# Patient Record
Sex: Male | Born: 1965
Health system: Southern US, Community
[De-identification: ages and names within clinical notes are randomized; demographics above are authoritative.]

## PROBLEM LIST (undated history)

## (undated) DIAGNOSIS — Z889 Allergy status to unspecified drugs, medicaments and biological substances status: Secondary | ICD-10-CM

## (undated) DIAGNOSIS — T7840XA Allergy, unspecified, initial encounter: Secondary | ICD-10-CM

## (undated) DIAGNOSIS — R1115 Cyclical vomiting syndrome unrelated to migraine: Secondary | ICD-10-CM

## (undated) DIAGNOSIS — R740 Nonspecific elevation of levels of transaminase and lactic acid dehydrogenase [LDH]: Principal | ICD-10-CM

## (undated) DIAGNOSIS — F17200 Nicotine dependence, unspecified, uncomplicated: Secondary | ICD-10-CM

## (undated) DIAGNOSIS — E039 Hypothyroidism, unspecified: Secondary | ICD-10-CM

## (undated) DIAGNOSIS — K760 Fatty (change of) liver, not elsewhere classified: Secondary | ICD-10-CM

## (undated) DIAGNOSIS — Z86010 Personal history of colonic polyps: Secondary | ICD-10-CM

## (undated) HISTORY — DX: Cyclical vomiting syndrome unrelated to migraine: R11.15

## (undated) HISTORY — DX: Nicotine dependence, unspecified, uncomplicated: F17.200

## (undated) HISTORY — DX: Nonspecific elevation of levels of transaminase and lactic acid dehydrogenase (ldh): R74.0

## (undated) HISTORY — DX: Allergy, unspecified, initial encounter: T78.40XA

## (undated) HISTORY — DX: Allergy status to unspecified drugs, medicaments and biological substances: Z88.9

## (undated) HISTORY — DX: Fatty (change of) liver, not elsewhere classified: K76.0

## (undated) HISTORY — PX: VASECTOMY: SHX75

## (undated) HISTORY — DX: Hypothyroidism, unspecified: E03.9

## (undated) HISTORY — DX: Personal history of colonic polyps: Z86.010

---

## 2012-04-14 ENCOUNTER — Encounter: Payer: Self-pay | Admitting: Family Medicine

## 2012-04-14 ENCOUNTER — Ambulatory Visit (INDEPENDENT_AMBULATORY_CARE_PROVIDER_SITE_OTHER): Payer: Self-pay | Admitting: Family Medicine

## 2012-04-14 VITALS — BP 110/70 | HR 72 | Temp 97.7°F | Ht 69.0 in | Wt 174.2 lb

## 2012-04-14 DIAGNOSIS — R112 Nausea with vomiting, unspecified: Secondary | ICD-10-CM | POA: Insufficient documentation

## 2012-04-14 DIAGNOSIS — F17200 Nicotine dependence, unspecified, uncomplicated: Secondary | ICD-10-CM

## 2012-04-14 DIAGNOSIS — Z87891 Personal history of nicotine dependence: Secondary | ICD-10-CM | POA: Insufficient documentation

## 2012-04-14 DIAGNOSIS — F1011 Alcohol abuse, in remission: Secondary | ICD-10-CM

## 2012-04-14 DIAGNOSIS — R109 Unspecified abdominal pain: Secondary | ICD-10-CM

## 2012-04-14 DIAGNOSIS — R1010 Upper abdominal pain, unspecified: Secondary | ICD-10-CM

## 2012-04-14 NOTE — Assessment & Plan Note (Addendum)
Concerning sxs, ?PUD, gallstones or hepatitis. Discussed I would want to start w/u with blood work (check CBC, lipase, CMP, possibly H pylori given endorses international travel) and then possibly abd [place]. Pt wants to wait until beginning of year for blood work 2/2 financial reasons, will return fasting for this and afterwards for physical. Discussed if any return of sxs or worsening to return to see [place] in interim. Advised against NSAIDs and EtOH. For reflux discussed could use OTC zantac or pepcid

## 2012-04-14 NOTE — Assessment & Plan Note (Signed)
Encouraged avoiding EtOH.

## 2012-04-14 NOTE — Addendum Note (Signed)
Addended by: Eustaquio Boyden on: 04/14/2012 11:35 AM   Modules accepted: Level of Service

## 2012-04-14 NOTE — Assessment & Plan Note (Signed)
Longtime smoker, 30+PY Encouraged cessation. States he is planning on quitting cold [place].  Contemplative.

## 2012-04-14 NOTE — Progress Notes (Signed)
Subjective:    Patient ID: Malik Perry, male    DOB: 1965/06/27, 46 y.o.   MRN: [id number]  HPI CC: new pt to establish  No prior PCP.  Has seen [facility] several times.  Latest [facility].  Several year history of abd pain (since 1992).  Nausea/vomiting for several days at a time.  RUQ abd pain associated with this.  Improves with IM phenergan.  Possibly related to eating hamburgers.  Did improve for 8 years after stopping hamburger meat, recently restarted eating hamburger 1 year ago and again getting ill.  Seen several weeks ago at [facility] urgent care, treated with phenergan IM and suppositories and improved.  + diarrhea with these episodes.  Occasional reflux sxs improved with tums.  Denies blood in stool, blood with emesis.  NBNB.  Denies early satiety.  Denies dysphagia.  Denies appetite changes or weight changes.    No EtOH in last 4 months.  Strong h/o EtOH use years ago.  Prior drank a fifth nightly for 6-7 yrs.  Now when he drinks tries to limit to 2 drinks/sitting. Denies any NSAID use.  Smoker - 1 ppd for 30 yrs.  Quit last year  But recently restarted.  Back to 1 ppd.  Smokes more when drinks.  Preventative: no recent CPE Flu shot - declines tetanus shot - done about 1990s.  Declines today  Lives with fiancee, 1 child, and cat Occupation: unemployed Edu: HS Activity: no regular exercise Diet: some water, fruits/vegetables occasionally  Medications and allergies reviewed and updated in chart.  Past histories reviewed and updated if relevant as below. There is no problem list on file for this patient.  Past Medical History  Diagnosis Date  . H/O seasonal allergies   . Smoker    Past Surgical History  Procedure Date  . Vasectomy    History  Substance Use Topics  . Smoking status: Current Every Day Smoker -- 1.0 packs/day for 30 years    Types: Cigarettes  . Smokeless tobacco: Never Used  . Alcohol Use: Yes     Comment: rarely (1-2 beer or a few  shots/sitting)   Family History  Problem Relation Age of Onset  . Heart disease Mother     MI and defibrillator in place  . Cancer Father     thyroid cancer  . Cancer Sister     2 half sisters with thyroid cancer  . COPD Maternal Grandmother     smoker  . Cancer Maternal Grandmother     grandmother' sisters - brain, lung, lymphoma  . Diabetes Mother   . Alcohol abuse Mother    No Known Allergies No current outpatient prescriptions on file prior to visit.     Review of Systems  Constitutional: Negative for fever, chills, activity change, appetite change, fatigue and unexpected weight change.  HENT: Negative for hearing loss and neck pain.   Eyes: Negative for visual disturbance.  Respiratory: Positive for cough and shortness of breath. Negative for chest tightness and wheezing.   Cardiovascular: Negative for chest pain, palpitations and leg swelling.  Gastrointestinal: Positive for nausea, vomiting, abdominal pain and diarrhea. Negative for constipation, blood in stool and abdominal distention.  Genitourinary: Negative for hematuria and difficulty urinating.  Musculoskeletal: Negative for myalgias and arthralgias.  Skin: Negative for rash.  Neurological: Positive for dizziness (orthostatic). Negative for seizures, syncope and headaches.  Hematological: Bruises/bleeds easily.  Psychiatric/Behavioral: Negative for dysphoric mood. The patient is not nervous/anxious.  Objective:   Physical Exam  Nursing note and vitals reviewed. Constitutional: He is oriented to person, place, and time. He appears well-developed and well-nourished. No distress.       Very deep voice  HENT:  Head: Normocephalic and atraumatic.  Right Ear: Hearing, tympanic membrane, external ear and ear canal normal.  Left Ear: Hearing, tympanic membrane, external ear and ear canal normal.  Nose: Nose normal. No mucosal edema or rhinorrhea.  Mouth/Throat: Uvula is midline, oropharynx is clear and moist  and mucous membranes are normal. No oropharyngeal exudate, posterior oropharyngeal edema, posterior oropharyngeal erythema or tonsillar abscesses.  Eyes: Conjunctivae normal and EOM are normal. Pupils are equal, round, and reactive to light. No scleral icterus.  Neck: Normal range of motion. Neck supple. No thyromegaly present.       No nodules appreciated  Cardiovascular: Normal rate, regular rhythm, normal heart sounds and intact distal pulses.   No murmur heard. Pulses:      Radial pulses are 2+ on the right side, and 2+ on the left side.  Pulmonary/Chest: Effort normal and breath sounds normal. No respiratory distress. He has no wheezes. He has no rales.  Abdominal: Soft. Bowel sounds are normal. He exhibits no distension and no mass. There is no hepatosplenomegaly. There is no tenderness. There is no rebound, no guarding and no CVA tenderness.  Musculoskeletal: Normal range of motion. He exhibits no edema.  Lymphadenopathy:    He has no cervical adenopathy.  Neurological: He is alert and oriented to person, place, and time.       CN grossly intact, station and gait intact  Skin: Skin is warm and dry. No rash noted.  Psychiatric: He has a normal mood and affect. His behavior is normal. Judgment and thought content normal.       Assessment & Plan:

## 2012-04-14 NOTE — Patient Instructions (Signed)
Work on quitting smoking!  Best thing you can do for your health.  Limit alcohol - to avoid liver damage. Return at your convenience fasting for blood work, afterwards for physical. If any worsening in the meantime, come back to see me. For reflux symptoms you could try over the counter zantac or pepcid. Good to meet you today, call us with questions.

## 2012-05-14 ENCOUNTER — Ambulatory Visit: Payer: Self-pay | Admitting: Family Medicine

## 2012-07-03 ENCOUNTER — Other Ambulatory Visit: Payer: Self-pay | Admitting: Family Medicine

## 2012-07-03 DIAGNOSIS — F1011 Alcohol abuse, in remission: Secondary | ICD-10-CM

## 2012-07-03 DIAGNOSIS — Z0000 Encounter for general adult medical examination without abnormal findings: Secondary | ICD-10-CM

## 2012-07-03 DIAGNOSIS — R1010 Upper abdominal pain, unspecified: Secondary | ICD-10-CM

## 2012-07-05 ENCOUNTER — Other Ambulatory Visit (INDEPENDENT_AMBULATORY_CARE_PROVIDER_SITE_OTHER): Payer: BC Managed Care – PPO

## 2012-07-05 DIAGNOSIS — F1011 Alcohol abuse, in remission: Secondary | ICD-10-CM

## 2012-07-05 DIAGNOSIS — Z0000 Encounter for general adult medical examination without abnormal findings: Secondary | ICD-10-CM

## 2012-07-05 DIAGNOSIS — R1010 Upper abdominal pain, unspecified: Secondary | ICD-10-CM

## 2012-07-05 DIAGNOSIS — R109 Unspecified abdominal pain: Secondary | ICD-10-CM

## 2012-07-05 LAB — COMPREHENSIVE METABOLIC PANEL
AST: 411 U/L — ABNORMAL HIGH (ref 0–37)
Alkaline Phosphatase: 59 U/L (ref 39–117)
BUN: 17 mg/dL (ref 6–23)
Creatinine, Ser: 1.4 mg/dL (ref 0.4–1.5)
Glucose, Bld: 94 mg/dL (ref 70–99)

## 2012-07-05 LAB — CBC WITH DIFFERENTIAL/PLATELET
Basophils Relative: 0.9 % (ref 0.0–3.0)
Eosinophils Absolute: 0.2 10*3/uL (ref 0.0–0.7)
Eosinophils Relative: 2.5 % (ref 0.0–5.0)
Hemoglobin: 12.2 g/dL — ABNORMAL LOW (ref 13.0–17.0)
Lymphocytes Relative: 41.7 % (ref 12.0–46.0)
MCHC: 33.5 g/dL (ref 30.0–36.0)
MCV: 96.7 fl (ref 78.0–100.0)
Monocytes Absolute: 0.6 10*3/uL (ref 0.1–1.0)
Neutro Abs: 4 10*3/uL (ref 1.4–7.7)
Neutrophils Relative %: 47.9 % (ref 43.0–77.0)
RBC: 3.76 Mil/uL — ABNORMAL LOW (ref 4.22–5.81)
WBC: 8.4 10*3/uL (ref 4.5–10.5)

## 2012-07-05 LAB — LIPID PANEL
Cholesterol: 237 mg/dL — ABNORMAL HIGH (ref 0–200)
HDL: 47.7 mg/dL (ref >39.00)
Triglycerides: 203 mg/dL — ABNORMAL HIGH (ref 0.0–149.0)
VLDL: 40.6 mg/dL — ABNORMAL HIGH (ref 0.0–40.0)

## 2012-07-05 LAB — LIPASE: Lipase: 31 U/L (ref 11.0–59.0)

## 2012-07-06 ENCOUNTER — Telehealth: Payer: Self-pay | Admitting: Family Medicine

## 2012-07-06 ENCOUNTER — Ambulatory Visit: Payer: BC Managed Care – PPO

## 2012-07-06 DIAGNOSIS — R7401 Elevation of levels of liver transaminase levels: Secondary | ICD-10-CM

## 2012-07-06 DIAGNOSIS — R1010 Upper abdominal pain, unspecified: Secondary | ICD-10-CM

## 2012-07-06 HISTORY — DX: Elevation of levels of liver transaminase levels: R74.01

## 2012-07-06 NOTE — Telephone Encounter (Signed)
Lab appt scheduled.

## 2012-07-06 NOTE — Telephone Encounter (Signed)
Unable to add viral hepatitis panel to blood in lab - can we have him come in for rpt blood work?  Thanks.

## 2012-07-07 LAB — FERRITIN: Ferritin: 294.9 ng/mL (ref 22.0–322.0)

## 2012-07-09 ENCOUNTER — Other Ambulatory Visit (INDEPENDENT_AMBULATORY_CARE_PROVIDER_SITE_OTHER): Payer: BC Managed Care – PPO

## 2012-07-09 DIAGNOSIS — R7401 Elevation of levels of liver transaminase levels: Secondary | ICD-10-CM

## 2012-07-09 DIAGNOSIS — R109 Unspecified abdominal pain: Secondary | ICD-10-CM

## 2012-07-09 DIAGNOSIS — R1010 Upper abdominal pain, unspecified: Secondary | ICD-10-CM

## 2012-07-10 DIAGNOSIS — K760 Fatty (change of) liver, not elsewhere classified: Secondary | ICD-10-CM

## 2012-07-10 HISTORY — DX: Fatty (change of) liver, not elsewhere classified: K76.0

## 2012-07-12 ENCOUNTER — Ambulatory Visit (INDEPENDENT_AMBULATORY_CARE_PROVIDER_SITE_OTHER): Payer: BC Managed Care – PPO | Admitting: Family Medicine

## 2012-07-12 ENCOUNTER — Encounter: Payer: Self-pay | Admitting: Family Medicine

## 2012-07-12 VITALS — BP 124/82 | HR 60 | Temp 98.1°F | Ht 69.0 in | Wt 182.8 lb

## 2012-07-12 DIAGNOSIS — E049 Nontoxic goiter, unspecified: Secondary | ICD-10-CM

## 2012-07-12 DIAGNOSIS — E039 Hypothyroidism, unspecified: Secondary | ICD-10-CM

## 2012-07-12 DIAGNOSIS — E010 Iodine-deficiency related diffuse (endemic) goiter: Secondary | ICD-10-CM

## 2012-07-12 DIAGNOSIS — F1011 Alcohol abuse, in remission: Secondary | ICD-10-CM

## 2012-07-12 DIAGNOSIS — R7401 Elevation of levels of liver transaminase levels: Secondary | ICD-10-CM

## 2012-07-12 DIAGNOSIS — F17200 Nicotine dependence, unspecified, uncomplicated: Secondary | ICD-10-CM

## 2012-07-12 HISTORY — DX: Hypothyroidism, unspecified: E03.9

## 2012-07-12 LAB — HEPATITIS PANEL, ACUTE
HCV Ab: NEGATIVE
Hep A IgM: NEGATIVE
Hep B C IgM: NEGATIVE

## 2012-07-12 NOTE — Patient Instructions (Signed)
Pass by Malik Perry's office for referral for thyroid and abdominal ultrasounds - this week if able. We will call you and may have you come back for more blood work depending on results. Good to see you today, call us with questions.

## 2012-07-12 NOTE — Assessment & Plan Note (Signed)
Continue to encourage cessation. 

## 2012-07-12 NOTE — Progress Notes (Signed)
Subjective:    Patient ID: Malik Perry, male    DOB: [birth year], 47 y.o.   MRN: [id number]  HPI CC: CPE converted to acute visit to discuss blood work  Since seen here 04/2012 to establish care - has been sick x3 separate times with nausea/vomiting.  Once to pizza, once to steak and once to [employer].  Remains nauseated for 2-3 days during these episodes.  Intermittent diarrhea with these emesis episodes.  Phenergan tablets/suppositories help with this.  Denies tylenol use.  Denies NSAID use including goody and bc powders, aspirin.  Denies EtOH use.  Denies blood in stool, blood with emesis. NBNB. Denies early satiety. Denies dysphagia. Denies appetite changes or weight changes. Chronic deep voice, since adolescence.  No EtOH in last 4 months. Strong h/o EtOH use years ago. Prior drank a fifth nightly for 6-7 yrs. Now when he drinks tries to limit to 2 drinks/sitting.  Smoker - 1 ppd for 30 yrs. Quit last year but recently restarted. Back to 1 ppd. Smokes more when he drinks. MJ user.  No fmhx liver problems.   ++ strong fmhx thyroid cancer (father and 2 half sisters).  Preventative: Flu shot - declines  tetanus shot - done about 1990s. Declines today   Lives with fiancee, 1 child, and cat  Occupation: unemployed  Edu: HS  Activity: no regular exercise  Diet: some water, fruits/vegetables occasionally  Wt Readings from Last 3 Encounters:  07/12/12 182 lb 12 oz (82.895 kg)  04/14/12 174 lb 4 oz (79.039 kg)   Medications and allergies reviewed and updated in chart.  Past histories reviewed and updated if relevant as below. Patient Active Problem List  Diagnosis  . Upper abdominal pain  . Smoker  . History of alcohol abuse  . Transaminitis   Past Medical History  Diagnosis Date  . H/O seasonal allergies   . Smoker    Past Surgical History  Procedure Date  . Vasectomy    History  Substance Use Topics  . Smoking status: Current Every Day Smoker -- 1.0  packs/day for 30 years    Types: Cigarettes  . Smokeless tobacco: Never Used  . Alcohol Use: Yes     Comment: rarely (1-2 beer or a few shots/sitting)   Family History  Problem Relation Age of Onset  . Heart disease Mother     MI and defibrillator in place  . Cancer Father     thyroid cancer  . Cancer Sister     2 half sisters with thyroid cancer  . COPD Maternal Grandmother     smoker  . Cancer Maternal Grandmother     grandmother' sisters - brain, lung, lymphoma  . Diabetes Mother   . Alcohol abuse Mother    No Known Allergies No current outpatient prescriptions on file prior to visit.    Review of Systems  Constitutional: Negative for fever, chills, activity change, appetite change, fatigue and unexpected weight change.  HENT: Negative for hearing loss and neck pain.   Eyes: Negative for visual disturbance.  Respiratory: Positive for shortness of breath (attributes to smoking). Negative for cough, chest tightness and wheezing.   Cardiovascular: Negative for chest pain, palpitations and leg swelling.  Gastrointestinal: Positive for nausea, vomiting, abdominal pain and diarrhea. Negative for constipation, blood in stool and abdominal distention.  Genitourinary: Negative for hematuria and difficulty urinating.  Musculoskeletal: Negative for myalgias and arthralgias.  Skin: Negative for rash.  Neurological: Positive for dizziness (orthostatic). Negative for seizures, syncope and  headaches.  Hematological: Bruises/bleeds easily.  Psychiatric/Behavioral: Negative for dysphoric mood. The patient is not nervous/anxious.        Objective:   Physical Exam  Nursing note and vitals reviewed. Constitutional: He is oriented to person, place, and time. He appears well-developed and well-nourished. No distress.  HENT:  Head: Normocephalic and atraumatic.  Right Ear: Hearing, tympanic membrane, external ear and ear canal normal.  Left Ear: Hearing, tympanic membrane, external ear  and ear canal normal.  Nose: Nose normal. No mucosal edema or rhinorrhea.  Mouth/Throat: Uvula is midline, oropharynx is clear and moist and mucous membranes are normal. No oropharyngeal exudate, posterior oropharyngeal edema, posterior oropharyngeal erythema or tonsillar abscesses.  Eyes: Conjunctivae normal and EOM are normal. Pupils are equal, round, and reactive to light.  Neck: Normal range of motion. Neck supple. Thyromegaly (bilateral, L > R) present.  Cardiovascular: Normal rate, regular rhythm, normal heart sounds and intact distal pulses.   No murmur heard. Pulses:      Radial pulses are 2+ on the right side, and 2+ on the left side.  Pulmonary/Chest: Effort normal and breath sounds normal. No respiratory distress. He has no wheezes. He has no rales.  Abdominal: Soft. Normal appearance and bowel sounds are normal. He exhibits no distension and no mass. There is no hepatosplenomegaly. There is no tenderness. There is no rebound and no guarding.       No HSM appreciated  Musculoskeletal: Normal range of motion.  Lymphadenopathy:    He has no cervical adenopathy.  Neurological: He is alert and oriented to person, place, and time.       CN grossly intact, station and gait intact  Skin: Skin is warm and dry. No rash noted.  Psychiatric: He has a normal mood and affect. His behavior is normal. Judgment and thought content normal.       Assessment & Plan:

## 2012-07-12 NOTE — Assessment & Plan Note (Addendum)
New marked transaminitis with normal ALP/bilirubin. In h/o heavy EtOH use, concern for alcoholic cirrhosis/hepatitis although plt normal. Check abd [place] today.  That will direct futher management. Discussed avoiding EtOH, NSAIDs and tylenol.

## 2012-07-12 NOTE — Assessment & Plan Note (Signed)
Encouraged to remain abstinent. 

## 2012-07-13 NOTE — Assessment & Plan Note (Signed)
New as well - along with thyromegaly. Given fmhx, check thyroid US.   If normal, start levothyroxine.

## 2012-07-19 ENCOUNTER — Ambulatory Visit
Admission: RE | Admit: 2012-07-19 | Discharge: 2012-07-19 | Disposition: A | Discharge status: Home / Self Care | Payer: BC Managed Care – PPO | Source: Ambulatory Visit | Attending: Family Medicine | Admitting: Family Medicine

## 2012-07-19 DIAGNOSIS — E039 Hypothyroidism, unspecified: Secondary | ICD-10-CM

## 2012-07-19 DIAGNOSIS — R7401 Elevation of levels of liver transaminase levels: Secondary | ICD-10-CM

## 2012-07-19 DIAGNOSIS — E010 Iodine-deficiency related diffuse (endemic) goiter: Secondary | ICD-10-CM

## 2012-07-20 ENCOUNTER — Other Ambulatory Visit: Payer: Self-pay | Admitting: Family Medicine

## 2012-07-20 DIAGNOSIS — R7401 Elevation of levels of liver transaminase levels: Secondary | ICD-10-CM

## 2012-07-20 MED ORDER — LEVOTHYROXINE SODIUM 88 MCG PO TABS: 88.0000 ug | ORAL_TABLET | Freq: Every day | ORAL | Status: DC

## 2012-07-28 ENCOUNTER — Other Ambulatory Visit: Payer: BC Managed Care – PPO

## 2012-07-28 ENCOUNTER — Encounter: Payer: Self-pay | Admitting: Family Medicine

## 2012-07-28 ENCOUNTER — Ambulatory Visit (INDEPENDENT_AMBULATORY_CARE_PROVIDER_SITE_OTHER): Payer: BC Managed Care – PPO | Admitting: Family Medicine

## 2012-07-28 VITALS — BP 114/70 | HR 60 | Temp 98.1°F | Wt 178.0 lb

## 2012-07-28 DIAGNOSIS — F1290 Cannabis use, unspecified, uncomplicated: Secondary | ICD-10-CM | POA: Insufficient documentation

## 2012-07-28 DIAGNOSIS — R7401 Elevation of levels of liver transaminase levels: Secondary | ICD-10-CM

## 2012-07-28 DIAGNOSIS — F1210 Cannabis abuse, uncomplicated: Secondary | ICD-10-CM

## 2012-07-28 DIAGNOSIS — F1011 Alcohol abuse, in remission: Secondary | ICD-10-CM

## 2012-07-28 DIAGNOSIS — F17200 Nicotine dependence, unspecified, uncomplicated: Secondary | ICD-10-CM

## 2012-07-28 DIAGNOSIS — E039 Hypothyroidism, unspecified: Secondary | ICD-10-CM

## 2012-07-28 LAB — COMPREHENSIVE METABOLIC PANEL
Alkaline Phosphatase: 52 U/L (ref 39–117)
Glucose, Bld: 80 mg/dL (ref 70–99)
Sodium: 137 mEq/L (ref 135–145)
Total Bilirubin: 0.5 mg/dL (ref 0.3–1.2)
Total Protein: 7.6 g/dL (ref 6.0–8.3)

## 2012-07-28 LAB — CERULOPLASMIN: Ceruloplasmin: 25 mg/dL (ref 20–60)

## 2012-07-28 LAB — PROTIME-INR: Prothrombin Time: 11.4 s (ref 10.2–12.4)

## 2012-07-28 NOTE — Assessment & Plan Note (Signed)
Planning on quitting at end of month.

## 2012-07-28 NOTE — Assessment & Plan Note (Signed)
Workup so far only showing fatty infiltration of liver by abd [place].  ?autoimmune hepatitis given concommitant hypothyroidism.   Will recheck LFTs today as well as autoimmune liver panel. Discussed if not improved - recommend referral to gastroenterologist and possible liver biopsy. ?cannabinoid hyperemesis syndrome.

## 2012-07-28 NOTE — Patient Instructions (Addendum)
Return in 3 months for recheck thyroid and afterwards office visit to discuss results. Blood work today.  If staying elevated, I will recommend referral to GI specialist.

## 2012-07-28 NOTE — Assessment & Plan Note (Signed)
Not interested in stopping this.

## 2012-07-28 NOTE — Assessment & Plan Note (Signed)
Strong fmhx thyroid disease.  Mild thyromegaly on [place] but no discrete nodules.  Anticipate autoimmune hypothyroidism. Discussed importance of thyroid balance for body's metabolism. Pt adamant about wanting to try dietary changes and nutrition regimen to improve thyroid disorder. Requests 3 months of intensive diet changes to try and manage hypothyroidism "naturally" then will return for rpt thyroid functions and if remains undercontrolled, agrees to start levothyroxine.

## 2012-07-28 NOTE — Assessment & Plan Note (Signed)
Endorsed abstinence.

## 2012-07-28 NOTE — Progress Notes (Signed)
  Subjective:    Patient ID: Malik Perry, male    DOB: September 15, 1965, 47 y.o.   MRN: [id number]  HPI CC: discuss thyroid  Pt refuses thyroid medication currently.  Wants to try 3 months of "gerson regimen"" with natural vegetables/juices, and whole wheat grass.  Smoking - planning on quitting end of February.  MJ - regular use - does not plan on stopping. Denies other recreational drugs. EtOH - no more drinking.  Had one beer several weeks ago, led to nausea/vomiting. No tylenol or NSAID use.  Markedly elevated transaminitis - workup has included AST 411 and ALT 357, creatinine of 1.4, normal total bili and CBC, INR, normal iron and ferritin, mildly low % sat, mild anemia with Hgb 12.2, negative acute viral hepatitis panel, negative H pylori, and TSH of 35 with free T4 0.  Abdominal ultrasound returned showing anticipated fatty infiltration of liver. Thyroid ultrasound returned with mild thyromegaly.    No recent episodes of nausea/vomiting.  Medications and allergies reviewed and updated in chart.  Past histories reviewed and updated if relevant as below. Patient Active Problem List  Diagnosis  . Upper abdominal pain  . Smoker  . History of alcohol abuse  . Transaminitis  . Hypothyroidism   Past Medical History  Diagnosis Date  . H/O seasonal allergies   . Smoker   . Transaminitis 07/06/2012  . Hypothyroidism 07/12/2012   Past Surgical History  Procedure Laterality Date  . Vasectomy     History  Substance Use Topics  . Smoking status: Current Every Day Smoker -- 1.00 packs/day for 30 years    Types: Cigarettes  . Smokeless tobacco: Never Used  . Alcohol Use: Yes     Comment: rarely (1-2 beer or a few shots/sitting)   Family History  Problem Relation Age of Onset  . Heart disease Mother     MI and defibrillator in place  . Cancer Father     thyroid cancer  . Cancer Sister     2 half sisters with thyroid cancer  . COPD Maternal Grandmother     smoker  . Cancer  Maternal Grandmother     grandmother' sisters - brain, lung, lymphoma  . Diabetes Mother   . Alcohol abuse Mother    No Known Allergies Current Outpatient Prescriptions on File Prior to Visit  Medication Sig Dispense Refill  . levothyroxine (SYNTHROID, LEVOTHROID) 88 MCG tablet Take 1 tablet (88 mcg total) by mouth daily. Take 1/2 pill for the first week then one whole pill daily  90 tablet  3   No current facility-administered medications on file prior to visit.    Review of Systems Per HPI    Objective:   Physical Exam  Nursing note and vitals reviewed. Constitutional: He appears well-developed and well-nourished. No distress.  Strong smell of marijuana follows patient       Assessment & Plan:

## 2012-07-29 LAB — ANA: Anti Nuclear Antibody(ANA): NEGATIVE

## 2012-07-30 LAB — ANTI-SMOOTH MUSCLE ANTIBODY, IGG: Smooth Muscle Ab: 6 U (ref <20)

## 2012-10-14 ENCOUNTER — Telehealth: Payer: Self-pay

## 2012-10-14 NOTE — Telephone Encounter (Signed)
Pt said has already discussed with Dr Reece Agar and Selena Batten that "he does not do prescription drugs"; pt is fearful of possible side effects of medication that is not all natural.  Pt said [facility] has an thyroid hormone made from all natural derivatives but it does require a prescription. Pt wants to know if Dr Reece Agar would consider writing prescription; if so (pt does not know name of med) but request Dr Reece Agar or Selena Batten to contact [facility] at [phone] to get more info.Please advise.

## 2012-10-17 NOTE — Telephone Encounter (Signed)
Last visit we agreed to a 3 month trial of natural treatment with healthy diet and if thyroid values not improved we had discussed starting thyroid medication. [facility] therapy is an unsanctioned treatment for cancer, doesn't treat thyroid disease. I still recommend synthroid or levothyroxine medication to treat his thyroid disease. If he would like to bring me information, I will review with him. If he'd like I can refer to endocrinologist who is a specialist in thyroid disease, to again review options with him.

## 2012-10-18 NOTE — Telephone Encounter (Signed)
Notified patient. He is going to drop off info for you this week to review and then discuss it with you at his follow up in June. He said this is a new thyroid treatment.

## 2012-11-08 ENCOUNTER — Ambulatory Visit (INDEPENDENT_AMBULATORY_CARE_PROVIDER_SITE_OTHER): Payer: BC Managed Care – PPO | Admitting: Family Medicine

## 2012-11-08 ENCOUNTER — Encounter: Payer: Self-pay | Admitting: Family Medicine

## 2012-11-08 VITALS — BP 118/72 | HR 60 | Temp 98.2°F | Wt 173.8 lb

## 2012-11-08 DIAGNOSIS — F1290 Cannabis use, unspecified, uncomplicated: Secondary | ICD-10-CM

## 2012-11-08 DIAGNOSIS — Z23 Encounter for immunization: Secondary | ICD-10-CM

## 2012-11-08 DIAGNOSIS — F1210 Cannabis abuse, uncomplicated: Secondary | ICD-10-CM

## 2012-11-08 DIAGNOSIS — E039 Hypothyroidism, unspecified: Secondary | ICD-10-CM

## 2012-11-08 MED ORDER — THYROID 30 MG PO TABS: 30.0000 mg | ORAL_TABLET | Freq: Every day | ORAL | Status: DC

## 2012-11-08 NOTE — Assessment & Plan Note (Signed)
Continue to encourage cessation. Advised when he comes in for office visits to avoid heavy pot use there have been patient complaints about MJ smell.

## 2012-11-08 NOTE — Patient Instructions (Signed)
Start armour thyroid 30mg  daily  Return in 6 weeks for lab visit to recheck thyroid function and we will titrate med accordingly. Good to see you today, call us with questions. I don't recommend coffee enemas.

## 2012-11-08 NOTE — Assessment & Plan Note (Signed)
Continues to decline levothyroxine medication. Requests trial of armour thyroid "because it's all naturla" - discussed my concerns with this med and not optimal treatment of diabetes.   Discussed pathophysiology of armour thyroid specifically T3 conversion to T4 (active metabolite) RTC 6 wks for lab visit.

## 2012-11-08 NOTE — Progress Notes (Signed)
  Subjective:    Patient ID: Malik Perry, male    DOB: 1965/06/13, 47 y.o.   MRN: [id number]  HPI CC: discuss thyroid  [name] presents today to discuss chronically uncontrolled hypothyroidism.  He has remained resistant to levothyroxine or any other synthetic thyroid hormone.  Juicing daily. Mother sick over last 1.5 months with heart disease, in and out of hospital - this has made him more stressed than normal.. Gets wheat grass shots.  Feeling well today.  Energy level remains low. Lab Results  Component Value Date   TSH 35.91* 07/05/2012   Smoking - continues smoking cigarettes, 1/2 ppd. MJ - continues to smoke heavily, not interested in quitting.  Requests tetanus vaccine today.  Past Medical History  Diagnosis Date  . H/O seasonal allergies   . Smoker   . Transaminitis 07/06/2012  . Hypothyroidism 07/12/2012    Review of Systems Per HPI    Objective:   Physical Exam    Assessment & Plan:

## 2012-12-15 ENCOUNTER — Other Ambulatory Visit (INDEPENDENT_AMBULATORY_CARE_PROVIDER_SITE_OTHER): Payer: BC Managed Care – PPO

## 2012-12-15 DIAGNOSIS — Z23 Encounter for immunization: Secondary | ICD-10-CM

## 2012-12-15 DIAGNOSIS — E039 Hypothyroidism, unspecified: Secondary | ICD-10-CM

## 2012-12-15 LAB — TSH: TSH: 53.33 u[IU]/mL — ABNORMAL HIGH (ref 0.35–5.50)

## 2012-12-16 LAB — T3, FREE: T3, Free: 1.5 pg/mL — ABNORMAL LOW (ref 2.3–4.2)

## 2012-12-19 ENCOUNTER — Other Ambulatory Visit: Payer: Self-pay | Admitting: Family Medicine

## 2012-12-19 DIAGNOSIS — E039 Hypothyroidism, unspecified: Secondary | ICD-10-CM

## 2012-12-19 MED ORDER — THYROID 60 MG PO TABS: 60.0000 mg | ORAL_TABLET | Freq: Every day | ORAL | Status: DC

## 2013-02-11 ENCOUNTER — Other Ambulatory Visit (INDEPENDENT_AMBULATORY_CARE_PROVIDER_SITE_OTHER): Payer: BC Managed Care – PPO

## 2013-02-11 DIAGNOSIS — E039 Hypothyroidism, unspecified: Secondary | ICD-10-CM

## 2013-02-13 ENCOUNTER — Other Ambulatory Visit: Payer: Self-pay | Admitting: Family Medicine

## 2013-02-13 DIAGNOSIS — E039 Hypothyroidism, unspecified: Secondary | ICD-10-CM

## 2013-02-13 MED ORDER — THYROID 90 MG PO TABS: 90.0000 mg | ORAL_TABLET | Freq: Every day | ORAL | Status: DC

## 2013-02-15 ENCOUNTER — Telehealth: Payer: Self-pay

## 2013-02-15 ENCOUNTER — Ambulatory Visit (INDEPENDENT_AMBULATORY_CARE_PROVIDER_SITE_OTHER): Payer: BC Managed Care – PPO | Admitting: Family Medicine

## 2013-02-15 ENCOUNTER — Encounter: Payer: Self-pay | Admitting: Family Medicine

## 2013-02-15 VITALS — BP 104/64 | HR 84 | Temp 97.5°F | Wt 153.5 lb

## 2013-02-15 DIAGNOSIS — R112 Nausea with vomiting, unspecified: Secondary | ICD-10-CM

## 2013-02-15 DIAGNOSIS — R7401 Elevation of levels of liver transaminase levels: Secondary | ICD-10-CM

## 2013-02-15 DIAGNOSIS — E039 Hypothyroidism, unspecified: Secondary | ICD-10-CM

## 2013-02-15 DIAGNOSIS — F1210 Cannabis abuse, uncomplicated: Secondary | ICD-10-CM

## 2013-02-15 DIAGNOSIS — K7689 Other specified diseases of liver: Secondary | ICD-10-CM

## 2013-02-15 DIAGNOSIS — F1290 Cannabis use, unspecified, uncomplicated: Secondary | ICD-10-CM

## 2013-02-15 DIAGNOSIS — K760 Fatty (change of) liver, not elsewhere classified: Secondary | ICD-10-CM

## 2013-02-15 MED ORDER — OMEPRAZOLE 40 MG PO CPDR: 40.0000 mg | DELAYED_RELEASE_CAPSULE | Freq: Every day | ORAL | Status: DC

## 2013-02-15 MED ORDER — ONDANSETRON 4 MG PO TBDP: 4.0000 mg | ORAL_TABLET | ORAL | Status: DC | PRN

## 2013-02-15 NOTE — Assessment & Plan Note (Signed)
To start higher dose of armour thyroid once feeling better.  90mg  daily.

## 2013-02-15 NOTE — Assessment & Plan Note (Signed)
Longstanding. Recheck LFTs once nausea/vomiting has resolved - return in 3-4 wks for this. ?fatty liver per recent abd [place]. Refer to GI for further eval of transaminitis.   Workup so far negative (acute hep panel, ANA, LKMB1, anti smooth muscle Ab).

## 2013-02-15 NOTE — Telephone Encounter (Signed)
Message left notifying Angie.

## 2013-02-15 NOTE — Assessment & Plan Note (Signed)
Discussed possible relation of n/v to MJ use. Pt not interested in stopping MJ use.

## 2013-02-15 NOTE — Progress Notes (Signed)
  Subjective:    Patient ID: Malik Perry, male    DOB: 05-Apr-1966, 47 y.o.   MRN: [id number]  HPI CC: not feeling well  4d ago started feeling ill - vomiting several times a day - foam and food.  NBNB.  Feeling epigastric burning.  Diaphoresis endorsed as well.   No sick contacts at home. No h/o EGD. Has phenergan suppositories at home - didn't help.  Weight loss noted.  Hasn't been trying. Wt Readings from Last 3 Encounters:  02/15/13 153 lb 8 oz (69.627 kg)  11/08/12 173 lb 12 oz (78.812 kg)  07/28/12 178 lb (80.74 kg)   No fevers/chills, no diarrhea or constipation, blood in stool.  No chest discomfort, palpitations.  Denies dysphagia, early satiety. No new foods.  No EtOH.   No NSAID use.  Smoker - 1 ppd. MJ - hasn't smoked since he's felt ill  On armour thyroid 60mg  for hypothyroidism.  Hasn't yet started recent increased dose to 90mg  daily.  Thyroid US 07/2012: THYROID ULTRASOUND  Technique: Ultrasound examination of the thyroid gland and adjacent soft tissues was performed.  Comparison: None.  Findings: The thyroid gland is diffusely heterogeneous and slightly enlarged.  Right thyroid lobe: 6.9 x 2.6 x 2.7 cm.  Left thyroid lobe: 5.5 x 1.6 x 2.3 cm.  Isthmus: 7 mm in thickness.  Focal nodules: None.  Lymphadenopathy: None visualized.  IMPRESSION:  Slightly enlarged gland without focal mass.  Past Medical History  Diagnosis Date  . H/O seasonal allergies   . Smoker   . Transaminitis 07/06/2012  . Hypothyroidism 07/12/2012     Review of Systems Per HPI    Objective:   Physical Exam  Nursing note and vitals reviewed. Constitutional: He appears well-developed and well-nourished. No distress.  HENT:  Head: Normocephalic and atraumatic.  Mouth/Throat: Oropharynx is clear and moist. No oropharyngeal exudate.  Eyes: Conjunctivae and EOM are normal. Pupils are equal, round, and reactive to light. No scleral icterus.  Cardiovascular: Normal rate, regular rhythm,  normal heart sounds and intact distal pulses.   No murmur heard. Pulmonary/Chest: Effort normal and breath sounds normal. No respiratory distress. He has no wheezes. He has no rales.  Abdominal: Soft. Normal appearance and bowel sounds are normal. He exhibits no distension and no mass. There is no hepatosplenomegaly. There is tenderness in the epigastric area. There is no rigidity, no rebound, no guarding and negative Murphy's sign.  Musculoskeletal: He exhibits no edema.  Skin: Skin is warm and dry. No rash noted.       Assessment & Plan:

## 2013-02-15 NOTE — Assessment & Plan Note (Signed)
4d h/o n/v associated with epigastric burning pain. Anticipate viral gastritis vs ulcer vs longer term gastritis. Treat with omeprazole 40mg  daily. H/o intermittent episodes of nausea/vomiting over last year. Anticipate weight loss due to armour thyroid - but given longstanding intermittent nature of above sxs, along with h/o transaminitis, will refer to GI for further eval.

## 2013-02-15 NOTE — Telephone Encounter (Signed)
Plz notify zofran dissolving tablets sent in.

## 2013-02-15 NOTE — Telephone Encounter (Signed)
Angie left v/m; pt was seen this morning and pt did not think he would need prescription for nausea; but when pt got home he started with nausea again. Request med for nausea sent to [facility]. Angie request cb.

## 2013-02-15 NOTE — Patient Instructions (Addendum)
Take omeprazole once daily for next 3-4 weeks, then as needed. Pass by Marion's office or we will give you a call to schedule appointment GI doctor. Good to see you today. Continue small frequent sips of fluids throughout the day. Bland diet until you're feeling better. Return in 2-3 weeks for blood work to recheck liver.

## 2013-02-17 ENCOUNTER — Encounter: Payer: Self-pay | Admitting: Internal Medicine

## 2013-03-01 ENCOUNTER — Ambulatory Visit: Payer: BC Managed Care – PPO | Admitting: Gastroenterology

## 2013-03-08 ENCOUNTER — Other Ambulatory Visit (INDEPENDENT_AMBULATORY_CARE_PROVIDER_SITE_OTHER): Payer: BC Managed Care – PPO

## 2013-03-08 DIAGNOSIS — E039 Hypothyroidism, unspecified: Secondary | ICD-10-CM

## 2013-03-08 DIAGNOSIS — R112 Nausea with vomiting, unspecified: Secondary | ICD-10-CM

## 2013-03-08 LAB — COMPREHENSIVE METABOLIC PANEL
ALT: 9 U/L (ref 0–53)
AST: 19 U/L (ref 0–37)
CO2: 28 mEq/L (ref 19–32)
Calcium: 9 mg/dL (ref 8.4–10.5)
Chloride: 103 mEq/L (ref 96–112)
Creatinine, Ser: 0.9 mg/dL (ref 0.4–1.5)
GFR: 94.97 mL/min (ref >60.00)
Glucose, Bld: 102 mg/dL — ABNORMAL HIGH (ref 70–99)
Sodium: 139 mEq/L (ref 135–145)
Total Bilirubin: 0.4 mg/dL (ref 0.3–1.2)
Total Protein: 6.7 g/dL (ref 6.0–8.3)

## 2013-03-08 LAB — TSH: TSH: 14.09 u[IU]/mL — ABNORMAL HIGH (ref 0.35–5.50)

## 2013-03-08 LAB — CBC WITH DIFFERENTIAL/PLATELET
Eosinophils Relative: 2.7 % (ref 0.0–5.0)
HCT: 42.7 % (ref 39.0–52.0)
Hemoglobin: 14.1 g/dL (ref 13.0–17.0)
Lymphocytes Relative: 29.2 % (ref 12.0–46.0)
Lymphs Abs: 3.2 10*3/uL (ref 0.7–4.0)
Monocytes Absolute: 1 10*3/uL (ref 0.1–1.0)
Monocytes Relative: 8.8 % (ref 3.0–12.0)
Neutro Abs: 6.4 10*3/uL (ref 1.4–7.7)
Platelets: 329 10*3/uL (ref 150.0–400.0)
WBC: 10.9 10*3/uL — ABNORMAL HIGH (ref 4.5–10.5)

## 2013-03-09 ENCOUNTER — Other Ambulatory Visit: Payer: Self-pay | Admitting: Family Medicine

## 2013-03-09 DIAGNOSIS — E039 Hypothyroidism, unspecified: Secondary | ICD-10-CM

## 2013-03-09 LAB — T3: T3, Total: 207 ng/dL — ABNORMAL HIGH (ref 80.0–204.0)

## 2013-03-09 MED ORDER — THYROID 120 MG PO TABS: 120.0000 mg | ORAL_TABLET | Freq: Every day | ORAL | Status: DC

## 2013-03-18 ENCOUNTER — Ambulatory Visit (INDEPENDENT_AMBULATORY_CARE_PROVIDER_SITE_OTHER): Payer: BC Managed Care – PPO | Admitting: Internal Medicine

## 2013-03-18 ENCOUNTER — Encounter: Payer: Self-pay | Admitting: Internal Medicine

## 2013-03-18 VITALS — BP 94/60 | HR 76 | Ht 68.75 in | Wt 159.0 lb

## 2013-03-18 DIAGNOSIS — R1115 Cyclical vomiting syndrome unrelated to migraine: Secondary | ICD-10-CM | POA: Insufficient documentation

## 2013-03-18 NOTE — Progress Notes (Signed)
Referred by: Eustaquio Boyden, MD  Subjective:    Patient ID: Malik Perry, male    DOB: 1966-04-13, 47 y.o.   MRN: [id number]  HPI Day she is a wasn't 47 year old white man who has had years of intermittent nausea and vomiting. It is described as sudden spontaneous and episodic. He thinks it's been around or been a problem since early 1990s where it was much more frequent having about 5-10 spells a year where he would have intermittent persistent vomiting from to 5 days at a time. He thought he was drinking too much then so he cut way down, and over time he was having one or 25-6 day episodes of nausea and vomiting a year. He quit drinking sodas as well and thought that that helped. In the last year he is at about 3 episodes which is a slight increase. He does not have any associated abdominal pain. He describes a sudden intense feeling of an urge to defecate and if he is able to do so he usually reports the attack but if not he will develop nausea with vomiting and have to take promethazine suppositories for relief. Sometimes he has to go to an urgent care to get an intramuscular about this and injection. He denies any history of chronic headaches or migraines. He does smoke marijuana regularly but does not have any vomiting with withdrawal of that and he does not have the characteristic need to take hot showers to resolve nausea and vomiting associated with vomiting syndrome associated with cannabis use. His weight fluctuates but in generally been okay. He did have abnormal transaminases in February, his TSH was 50. An ultrasound showed a fatty liver at that time. His LFTs and his TSH have either normalize her near-normal eyes at this point. Lost weight as he is titrated upward on a thyroid supplement. He denies any rectal bleeding. His GI review of systems is otherwise negative. There is no dysphagia or persistent or consistent weight loss. No Known Allergies Outpatient Prescriptions Prior to Visit   Medication Sig Dispense Refill  . omeprazole (PRILOSEC) 40 MG capsule Take 1 capsule (40 mg total) by mouth daily.  30 capsule  3  . ondansetron (ZOFRAN ODT) 4 MG disintegrating tablet Take 1 tablet (4 mg total) by mouth every 4 (four) hours as needed for nausea.  30 tablet  0  . promethazine (PHENERGAN) 25 MG suppository Place 25 mg rectally every 6 (six) hours as needed for nausea.      Marland Kitchen thyroid (ARMOUR) 120 MG tablet Take 1 tablet (120 mg total) by mouth daily.  30 tablet  11   No facility-administered medications prior to visit.   Past Medical History  Diagnosis Date  . H/O seasonal allergies   . Smoker   . Transaminitis 07/06/2012  . Hypothyroidism 07/12/2012  . Fatty liver 07/2012   Past Surgical History  Procedure Laterality Date  . Vasectomy     History   Social History  . Marital Status: Married    Spouse Name: N/A    Number of Children: N/A  . Years of Education: N/A   Social History Main Topics  . Smoking status: Current Every Day Smoker -- 0.50 packs/day for 30 years    Types: Cigarettes  . Smokeless tobacco: Never Used  . Alcohol Use: Yes     Comment: rarely (1-2 beer or a few shots/sitting)  . Drug Use: Yes     Comment: regular MJ use  . Sexual Activity: None  Other Topics Concern  . 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 Heart disease Mother     MI and defibrillator in place  . Thyroid cancer Father     thyroid cancer  . Thyroid cancer Sister     2 half sisters with thyroid cancer  . COPD Maternal Grandmother     smoker  . Breast cancer Maternal Grandmother     grandmother' sisters - brain, lung, lymphoma  . Diabetes Mother   . Alcohol  abuse Mother    Review of Systems Positive for back pain all other review of systems are negative at this time    Objective:   Physical Exam General:  Well-developed, well-nourished and in no acute distress Eyes:  anicteric. ENT:   Mouth and posterior pharynx free of lesions.  Neck:   supple w/o thyromegaly or mass.  Lungs: Clear to auscultation bilaterally. Heart:  S1S2, no rubs, murmurs, gallops. Abdomen:  soft, non-tender, no hepatosplenomegaly, hernia, or mass and BS+.  Lymph:  no cervical or supraclavicular adenopathy. Extremities:   no edema Skin   no rash. Neuro:  A&O x 3.  Psych:  appropriate mood and  Affect.   Data Reviewed: Lab Results  Component Value Date   WBC 10.9* 03/08/2013   HGB 14.1 03/08/2013   HCT 42.7 03/08/2013   MCV 92.5 03/08/2013   PLT 329.0 03/08/2013     Chemistry      Component Value Date/Time   NA 139 03/08/2013 0819   K 4.4 03/08/2013 0819   CL 103 03/08/2013 0819   CO2 28 03/08/2013 0819   BUN 17 03/08/2013 0819   CREATININE 0.9 03/08/2013 0819      Component Value Date/Time   CALCIUM 9.0 03/08/2013 0819   ALKPHOS 55 03/08/2013 0819   AST 19 03/08/2013 0819   ALT 9 03/08/2013 0819   BILITOT 0.4 03/08/2013 0819     Lab Results  Component Value Date   TSH 14.09* 03/08/2013   Lab Results  Component Value Date   LIPASE 31.0 07/05/2012      Assessment & Plan:   1. Cyclic vomiting syndrome

## 2013-03-18 NOTE — Assessment & Plan Note (Signed)
His clinical scenario is compatible with cyclic vomiting syndrome. I did suggest that an upper endoscopy was reasonable but given the lack of weight loss dysphagia or anemia or bleeding it's not essential. He decided not to pursue this. He says that he is able to control things well with a promethazine suppositories. I've advised him that if he has more frequent problems suppressive therapy with something like a tricyclic agent or perhaps a beta blocker would be useful he may return. Information about cyclic vomiting syndrome and rapid will resources of the web are provided to the patient. It does not seem like he has cannabis hyperemesis syndrome.

## 2013-03-18 NOTE — Patient Instructions (Addendum)
You have been given a separate informational sheet regarding your tobacco use, the importance of quitting and local resources to help you quit.  Continue to treat yourself with the generic phenergan to treat your cyclic vomiting syndrome.  We have given you information to read on this syndrome today.  Follow up with [place] as needed.  I appreciate the opportunity to care for you.

## 2013-04-07 ENCOUNTER — Other Ambulatory Visit (INDEPENDENT_AMBULATORY_CARE_PROVIDER_SITE_OTHER): Payer: BC Managed Care – PPO

## 2013-04-07 DIAGNOSIS — E039 Hypothyroidism, unspecified: Secondary | ICD-10-CM

## 2013-04-07 LAB — TSH: TSH: 10.46 u[IU]/mL — ABNORMAL HIGH (ref 0.35–5.50)

## 2013-04-07 LAB — T3: T3, Total: 140.4 ng/dL (ref 80.0–204.0)

## 2013-04-10 ENCOUNTER — Other Ambulatory Visit: Payer: Self-pay | Admitting: Family Medicine

## 2013-04-10 DIAGNOSIS — E039 Hypothyroidism, unspecified: Secondary | ICD-10-CM

## 2013-04-10 MED ORDER — THYROID 120 MG PO TABS: 120.0000 mg | ORAL_TABLET | Freq: Every day | ORAL | Status: DC

## 2013-05-25 ENCOUNTER — Other Ambulatory Visit: Payer: BC Managed Care – PPO

## 2013-05-27 IMAGING — US US ABDOMEN COMPLETE
1 series · 14 of 25 positions shown · non-contrast
Comparison: None.

CLINICAL DATA: Elevated transaminase. Nausea and vomiting.  No
pain.

COMPLETE ABDOMINAL ULTRASOUND

[Series 1: us abdomen complete · 0.27mm/px · 14 of 79 slices shown]
[im 1/79]
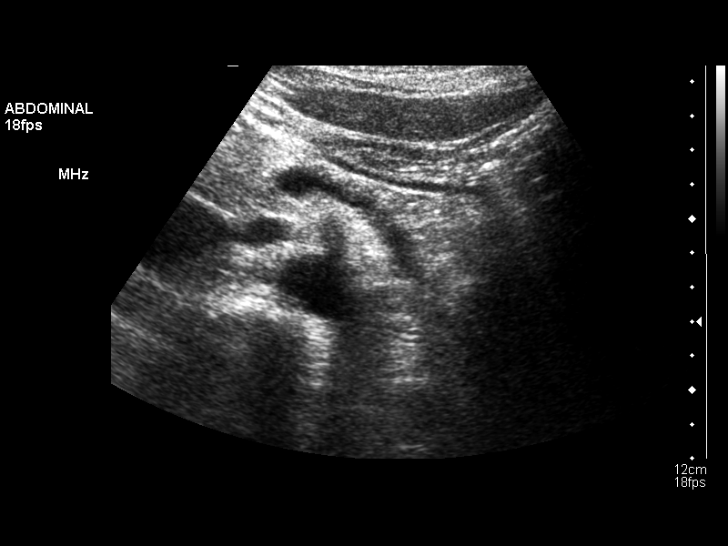
[im 7/79]
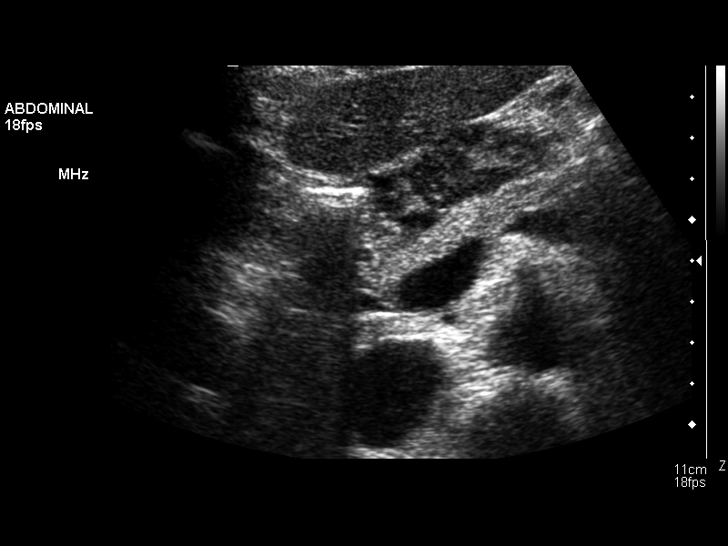
[im 14/79]
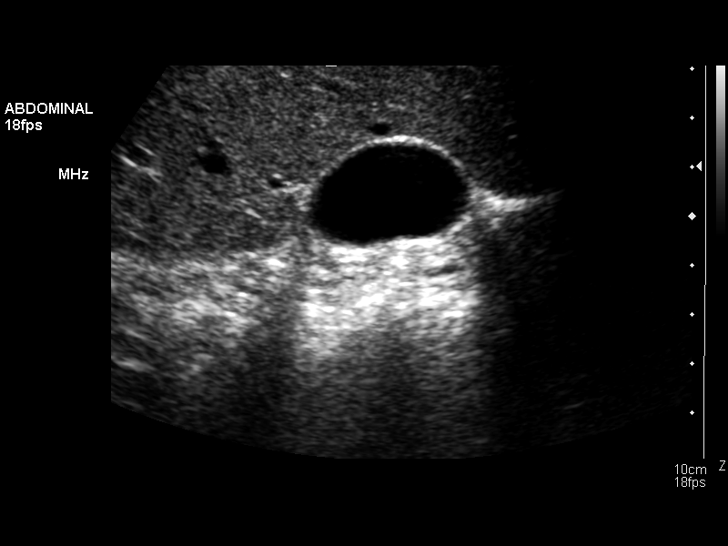
[im 20/79]
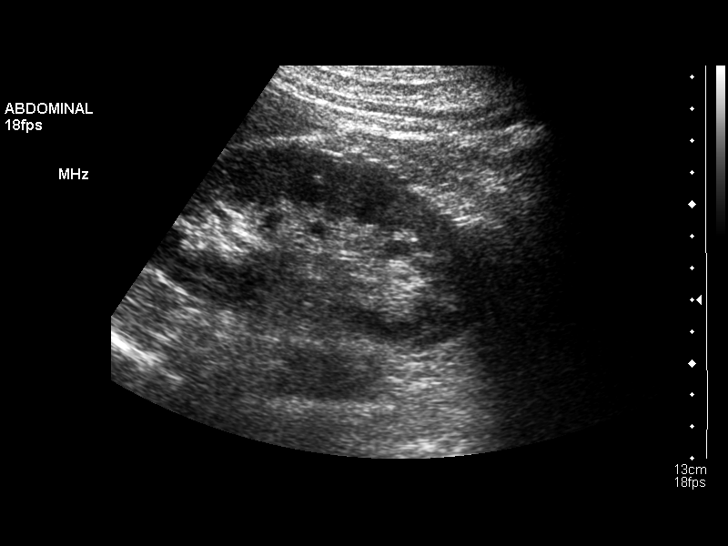
[im 27/79]
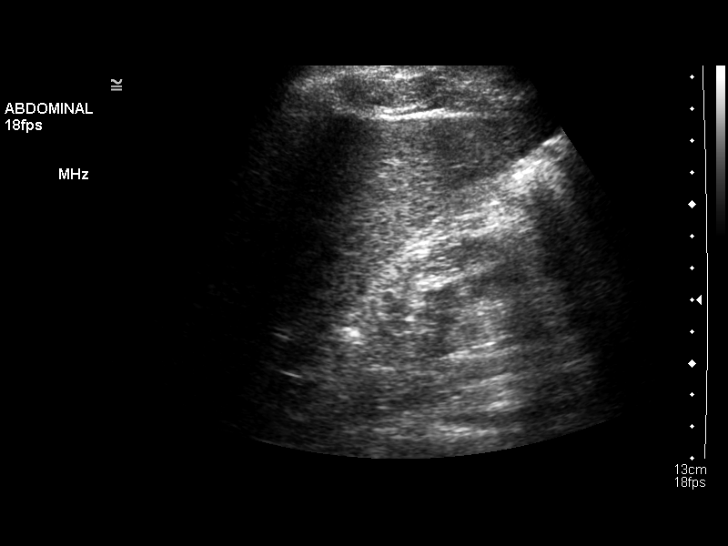
[im 30/79]
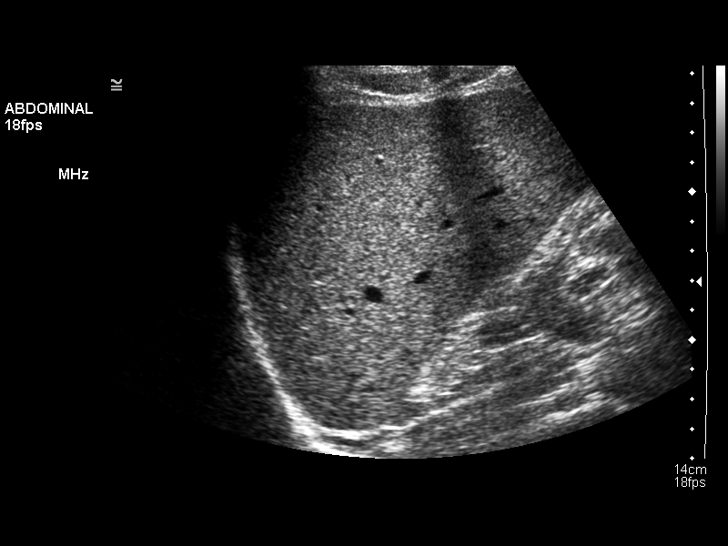
[im 36/79]
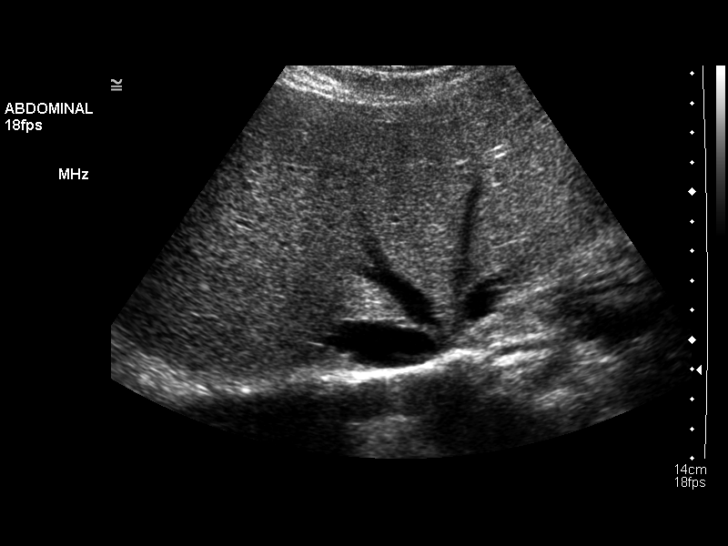
[im 43/79]
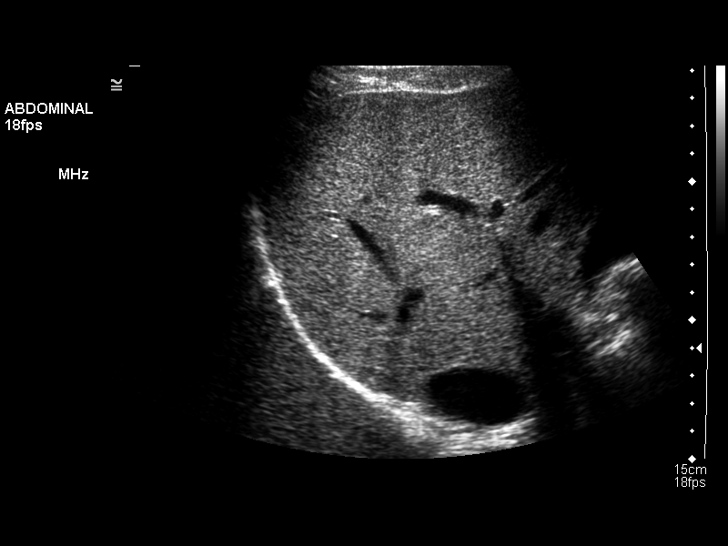
[im 49/79]
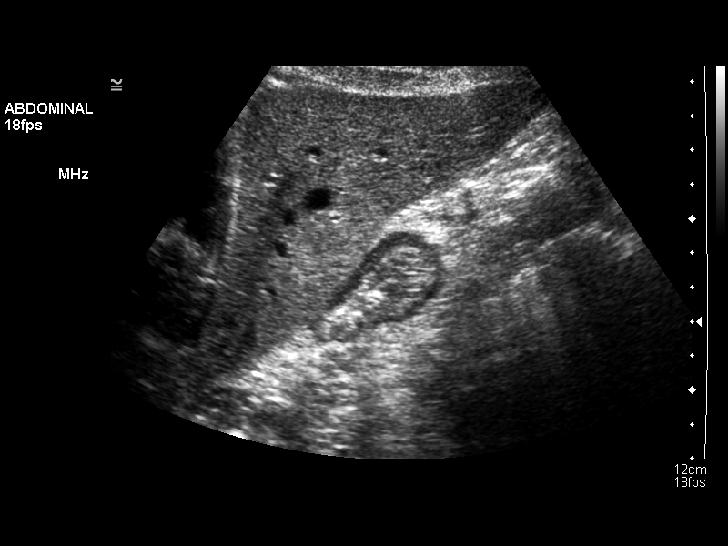
[im 53/79]
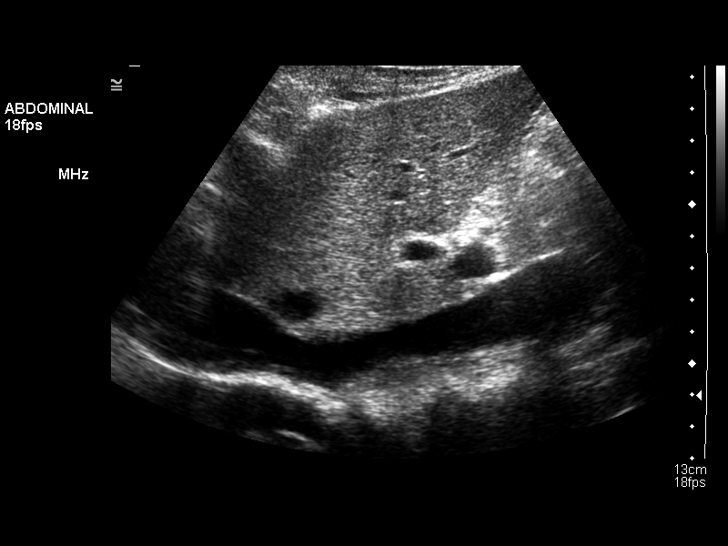
[im 59/79]
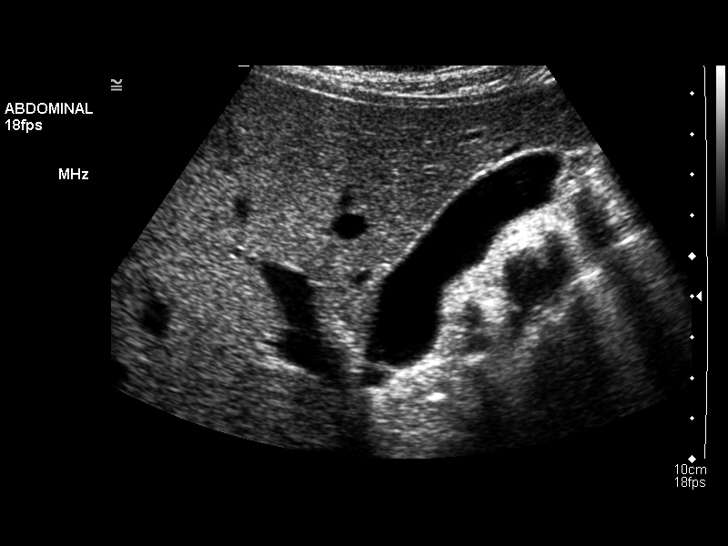
[im 66/79]
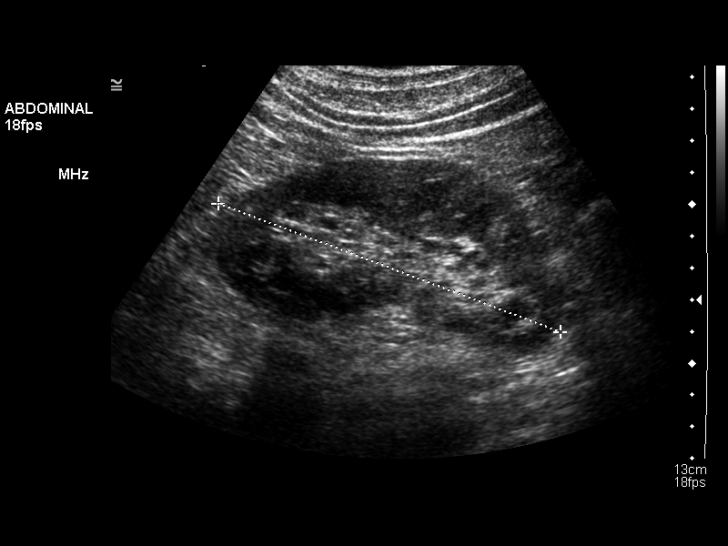
[im 72/79]
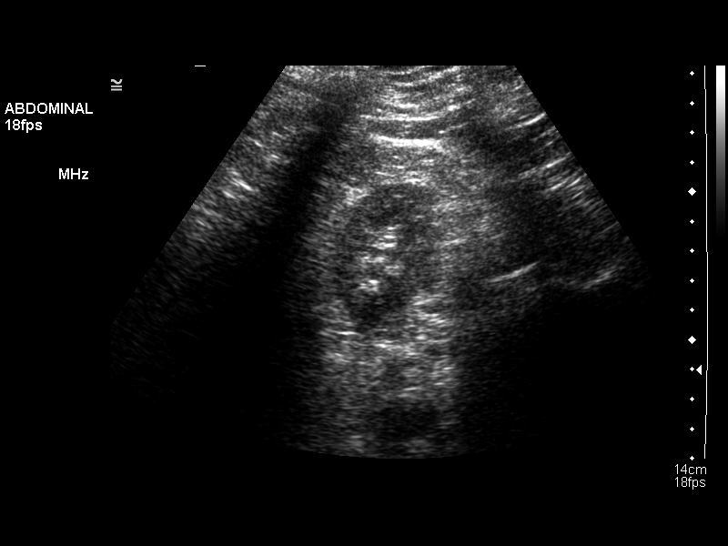
[im 79/79]
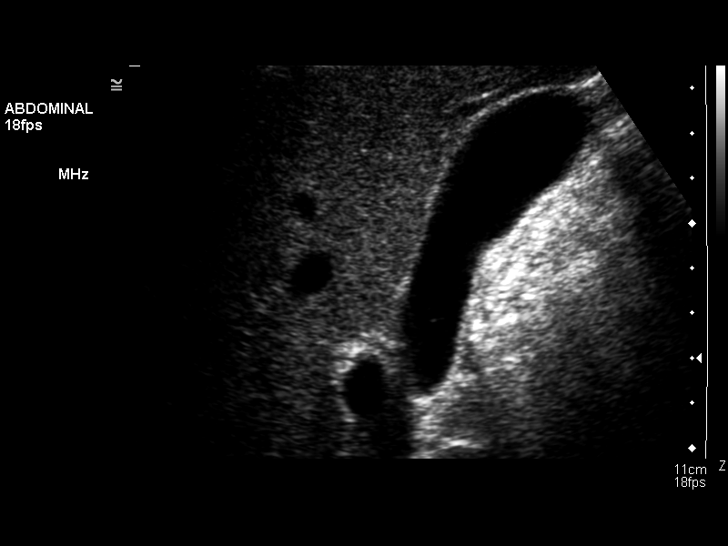

[14 of 25 positions shown; findings below may reference images not displayed]

FINDINGS: Gallbladder:  No gallstones, gallbladder wall thickening, or
pericholecystic fluid.

Common bile duct:  4.2 mm

Liver:  Increased echogenicity with mild heterogeneity suggestive
of fatty infiltration without focal mass or intrapelvic biliary
duct dilation.

IVC:  Appears normal.

Pancreas:  No focal abnormality seen.

Spleen:  2.3 cm.  No focal mass.

Right Kidney:  11.5 cm. No hydronephrosis or renal mass..

Left Kidney:  11.9 cm. No hydronephrosis or renal mass..

Abdominal aorta:  No aneurysm identified.
IMPRESSION: Fatty infiltration of the liver suspected.  Please see above.

## 2014-04-04 ENCOUNTER — Telehealth: Payer: Self-pay | Admitting: Family Medicine

## 2014-04-04 ENCOUNTER — Ambulatory Visit (INDEPENDENT_AMBULATORY_CARE_PROVIDER_SITE_OTHER): Payer: BC Managed Care – PPO | Admitting: Family Medicine

## 2014-04-04 ENCOUNTER — Encounter: Payer: Self-pay | Admitting: Family Medicine

## 2014-04-04 VITALS — BP 134/82 | HR 76 | Temp 97.4°F

## 2014-04-04 DIAGNOSIS — E039 Hypothyroidism, unspecified: Secondary | ICD-10-CM

## 2014-04-04 DIAGNOSIS — G43A0 Cyclical vomiting, not intractable: Secondary | ICD-10-CM

## 2014-04-04 DIAGNOSIS — R1115 Cyclical vomiting syndrome unrelated to migraine: Secondary | ICD-10-CM

## 2014-04-04 MED ORDER — PROMETHAZINE HCL 25 MG PO TABS: 25.0000 mg | ORAL_TABLET | Freq: Three times a day (TID) | ORAL | Status: DC | PRN

## 2014-04-04 MED ORDER — PROMETHAZINE HCL 25 MG/ML IJ SOLN
25.0000 mg | Freq: Once | INTRAMUSCULAR | Status: AC
  Administered 2014-04-04: 25 mg via INTRAMUSCULAR

## 2014-04-04 NOTE — Assessment & Plan Note (Signed)
Consistent with cyclic vomiting flare. Phenergan 25mg  IM shot today and sent home with phenergan 25mg  tablets Pt aware to notify us or return if not improving as expected.

## 2014-04-04 NOTE — Progress Notes (Signed)
   BP 134/82  Pulse 76  Temp(Src) 97.4 F (36.3 C) (Oral)   CC: vomiting/nausea  Subjective:    Patient ID: Malik Perry, male    DOB: 09-09-1965, 48 y.o.   MRN: [id number]  HPI: Malik Perry is a 48 y.o. male presenting on 04/04/2014 for Emesis   Patient with hypothyroidism and cyclic vomiting syndrome presents with flare of same. Ongoing for last 5 days. Started feeling better yesterday (only vomited x1), then sxs intensified over the rest of the morning (since then has had 6-7 emesis). Foam/liquid coming up, NBNB.  No abd pain, just sore muscles. No diarrhea, no fevers/chills. No new foods, no sick contacts at home.  Staying hydrated with water, ginger ale, sprite, voiding well.    Lab Results  Component Value Date   TSH 10.46* 04/07/2013   Wt Readings from Last 3 Encounters:  03/18/13 159 lb (72.122 kg)  02/15/13 153 lb 8 oz (69.627 kg)  11/08/12 173 lb 12 oz (78.812 kg)   There is no weight on file to calculate BMI.  Relevant past medical, surgical, family and social history reviewed and updated as indicated.  Allergies and medications reviewed and updated. Current Outpatient Prescriptions on File Prior to Visit  Medication Sig  . promethazine (PHENERGAN) 25 MG tablet Take 1 tablet (25 mg total) by mouth every 8 (eight) hours as needed for nausea or vomiting.  . thyroid (ARMOUR) 120 MG tablet Take 1 tablet (120 mg total) by mouth daily.   No current facility-administered medications on file prior to visit.   Past Medical History  Diagnosis Date  . H/O seasonal allergies   . Smoker   . Transaminitis 07/06/2012  . Hypothyroidism 07/12/2012  . Fatty liver 07/2012  . Cyclic vomiting syndrome     Review of Systems Per HPI unless specifically indicated above    Objective:    BP 134/82  Pulse 76  Temp(Src) 97.4 F (36.3 C) (Oral)  Physical Exam  Nursing note and vitals reviewed. Constitutional: He appears well-developed and well-nourished. No distress.    Smells of marijuana Tired, nontoxic  HENT:  Mouth/Throat: Oropharynx is clear and moist. No oropharyngeal exudate.  Eyes: Conjunctivae and EOM are normal. Pupils are equal, round, and reactive to light.  Cardiovascular: Normal rate, regular rhythm, normal heart sounds and intact distal pulses.   No murmur heard. Pulmonary/Chest: Effort normal and breath sounds normal. No respiratory distress. He has no wheezes. He has no rales.  Abdominal: Soft. Bowel sounds are normal. He exhibits no distension and no mass. There is no tenderness. There is no rebound and no guarding.  Musculoskeletal: He exhibits no edema.       Assessment & Plan:   Problem List Items Addressed This Visit   Hypothyroidism     Discussed pt due for f/u - advised him to schedule f/u appt when feeling better for TFTs    Cyclic vomiting syndrome - Primary     Consistent with cyclic vomiting flare. Phenergan 25mg  IM shot today and sent home with phenergan 25mg  tablets Pt aware to notify us or return if not improving as expected.        Follow up plan: Return if symptoms worsen or fail to improve.

## 2014-04-04 NOTE — Patient Instructions (Addendum)
This does sound like cyclic vomiting flare. Shot of phenergan - then may continue phenergan at home.  Cyclic Vomiting Syndrome Cyclic vomiting syndrome is a benign condition in which patients experience bouts or cycles of severe nausea and vomiting that last for hours or even days. The bouts of nausea and vomiting alternate with longer periods of no symptoms and generally good health. Cyclic vomiting syndrome occurs mostly in children, but can affect adults. CAUSES  [facility] has no known cause. Each episode is typically similar to the previous ones. The episodes tend to:   Start at about the same time of day.  Last the same length of time.  Present the same symptoms at the same level of intensity. Cyclic vomiting syndrome can begin at any age in children and adults. Cyclic vomiting syndrome usually starts between the ages of 3 and 7 years. In adults, episodes tend to occur less often than they do in children, but they last longer. Furthermore, the events or situations that trigger episodes in adults cannot always be pinpointed as easily as they can in children. There are 4 phases of cyclic vomiting syndrome: 1. Prodrome. The prodrome phase signals that an episode of nausea and vomiting is about to begin. This phase can last from just a few minutes to several hours. This phase is often marked by belly (abdominal) pain. Sometimes taking medicine early in the prodrome phase can stop an episode in progress. However, sometimes there is no warning. A person may simply wake up in the middle of the night or early morning and begin vomiting. 2. Episode. The episode phase consists of:  Severe vomiting.  Nausea.  Gagging (retching). 3. Recovery. The recovery phase begins when the nausea and vomiting stop. Healthy color, appetite, and energy return. 4. Symptom-free interval. The symptom-free interval phase is the period between episodes when no symptoms are present. TRIGGERS Episodes can be triggered by an  infection or event. Examples of triggers include:  Infections.  Colds, allergies, sinus problems, and the flu.  Eating certain foods such as chocolate or cheese.  Foods with monosodium glutamate (MSG) or preservatives.  Fast foods.  Pre-packaged foods.  Foods with low nutritional value (junk foods).  Overeating.  Eating just before going to bed.  Hot weather.  Dehydration.  Not enough sleep or poor sleep quality.  Physical exhaustion.  Menstruation.  Motion sickness.  Emotional stress (school or home difficulties).  Excitement or stress. SYMPTOMS  The main symptoms of cyclic vomiting syndrome are:  Severe vomiting.  Nausea.  Gagging (retching). Episodes usually begin at night or the first thing in the morning. Episodes may include vomiting or retching up to 5 or 6 times an hour during the worst of the episode. Episodes usually last anywhere from 1 to 4 days. Episodes can last for up to 10 days. Other symptoms include:  Paleness.  Exhaustion.  Listlessness.  Abdominal pain.  Loose stools or diarrhea. Sometimes the nausea and vomiting are so severe that a person appears to be almost unconscious. Sensitivity to light, headache, fever, dizziness, may also accompany an episode. In addition, the vomiting may cause drooling and excessive thirst. Drinking water usually leads to more vomiting, though the water can dilute the acid in the vomit, making the episode a little less painful. Continuous vomiting can lead to dehydration, which means that the body has lost excessive water and salts. DIAGNOSIS  Cyclic vomiting syndrome is hard to diagnose because there are no clear tests to identify it. A caregiver must  diagnose cyclic vomiting syndrome by looking at symptoms and medical history. A caregiver must exclude more common diseases or disorders that can also cause nausea and vomiting. Also, diagnosis takes time because caregivers need to identify a pattern or cycle to  the vomiting. TREATMENT  Cyclic vomiting syndrome cannot be cured. Treatment varies, but people with cyclic vomiting syndrome should get plenty of rest and sleep and take medications that prevent, stop, or lessen the vomiting episodes and other symptoms. People whose episodes are frequent and long-lasting may be treated during the symptom-free intervals in an effort to prevent or ease future episodes. The symptom-free phase is a good time to eliminate anything known to trigger an episode. For example, if episodes are brought on by stress or excitement, this period is the time to find ways to reduce stress and stay calm. If sinus problems or allergies cause episodes, those conditions should be treated. The triggers listed above should be avoided or prevented. Because of the similarities between migraine and cyclic vomiting syndrome, caregivers treat some people with severe cyclic vomiting syndrome with drugs that are also used for migraine headaches. The drugs are designed to:  Prevent episodes.  Reduce their frequency.  Lessen their severity. HOME CARE INSTRUCTIONS Once a vomiting episode begins, treatment is supportive. It helps to stay in bed and sleep in a dark, quiet room. Severe nausea and vomiting may require hospitalization and intravenous (IV) fluids to prevent dehydration. Relaxing medications (sedatives) may help if the nausea continues. Sometimes, during the prodrome phase, it is possible to stop an episode from happening altogether. Only take over-the-counter or prescription medicines for pain, discomfort or fever as directed by your caregiver. Do not give aspirin to children. During the recovery phase, drinking water and replacing lost electrolytes (salts in the blood) are very important. Electrolytes are salts that the body needs to function well and stay healthy. Symptoms during the recovery phase can vary. Some people find that their appetites return to normal immediately, while others  need to begin by drinking clear liquids and then move slowly to solid food. RELATED COMPLICATIONS The severe vomiting that defines cyclic vomiting syndrome is a risk factor for several complications:  Dehydration--Vomiting causes the body to lose water quickly.  Electrolyte imbalance--Vomiting also causes the body to lose the important salts it needs to keep working properly.  Peptic esophagitis--The tube that connects the mouth to the stomach (esophagus) becomes injured from the stomach acid that comes up with the vomit.  Hematemesis--The esophagus becomes irritated and bleeds, so blood mixes with the vomit.  Mallory-Weiss tear--The lower end of the esophagus may tear open or the stomach may bruise from vomiting or retching.  Tooth decay--The acid in the vomit can hurt the teeth by corroding the tooth enamel. SEEK MEDICAL CARE IF: You have questions or problems. Document Released: 08/04/2001 Document Revised: 08/18/2011 Document Reviewed: 09/02/2010 [facility] Patient Information 2015 [place], [place]. This information is not intended to replace advice given to you by your health care provider. Make sure you discuss any questions you have with your health care provider.

## 2014-04-04 NOTE — Telephone Encounter (Signed)
Patient Information:  Caller Name: Willette Alma  Phone: [phone]  Patient: Malik, Perry  Gender: Male  DOB: April 09, 1966  Age: 48 Years  PCP: R[facility])  Office Follow Up:  Does the office need to follow up with this patient?: Yes  Instructions For The Office: Declines appt.  History of cyclic vomiting and same symptoms. Request prescription for phenergan.   Please contact  RN Note:  Declines appt.  History of cyclic vomiting and same symptoms. Request prescription for phenergan.   Please contact  Symptoms  Reason For Call & Symptoms: Girlfriend states that her Malik Perry has been vomiting.  Hx of cyclic vomiting . Onset Friday 03/31/14.  She reports that he has seen Gastroenterology (Dr. Carlean Purl)  for this-. LOV 02/2013 with Dr.G and 03/2013 . No cause . Just treat symptoms.   She is requesting a prescription for phenergan. Zofran does not work.  Emesis x4.  He has not had Thyroid medication since Thursday 03/30/14.  Offered appt . Declined. This is cyclic. Hx of same.  Reviewed Health History In EMR: Yes  Reviewed Medications In EMR: Yes  Reviewed Allergies In EMR: Yes  Reviewed Surgeries / Procedures: Yes  Date of Onset of Symptoms: 03/31/2014  Treatments Tried: shaved ice  Treatments Tried Worked: No  Guideline(s) Used:  Vomiting  Disposition Per Guideline:   See Today in Office  Reason For Disposition Reached:   Vomiting lasts > 48 hours  Advice Given:  Clear Liquids:  Sip water or a rehydration drink (e.g., Gatorade or Powerade).  Other options: 1/2 strength flat lemon-lime soda or ginger ale.  After 4 hours without vomiting, increase the amount.  For Non-stop Vomiting, Try Sleeping:  Try to go to sleep (Reason: sleep often empties the stomach and relieves the need to vomit).  When you awaken, resume drinking liquids. Water works best initially.  Avoid Nonprescription Medicines:  Call if vomiting a prescription medicine.  Call Back  If:  Vomiting lasts for more than 2 days (48 hours)  Signs of dehydration occur  You become worse.  RN Overrode Recommendation:  Patient Requests Prescription  Declines appt.  History of cyclic vomiting and same symptoms. Request prescription for phenergan.   Please contact

## 2014-04-04 NOTE — Progress Notes (Signed)
Pre visit review using our clinic review tool, if applicable. No additional management support is needed unless otherwise documented below in the visit note. 

## 2014-04-04 NOTE — Addendum Note (Signed)
Addended by: Royann Shivers A on: 04/04/2014 12:21 PM   Modules accepted: Orders

## 2014-04-04 NOTE — Telephone Encounter (Signed)
Patient notified but was able to get appt today and wanted to keep appt so that he could get a phenergan injection.

## 2014-04-04 NOTE — Assessment & Plan Note (Signed)
Discussed pt due for f/u - advised him to schedule f/u appt when feeling better for TFTs

## 2014-04-04 NOTE — Telephone Encounter (Signed)
plz notify med sent to pharmacy. Update if not improving as expected.

## 2014-04-05 ENCOUNTER — Telehealth: Payer: Self-pay | Admitting: Family Medicine

## 2014-06-26 ENCOUNTER — Encounter: Payer: Self-pay | Admitting: Family Medicine

## 2014-06-26 ENCOUNTER — Ambulatory Visit (INDEPENDENT_AMBULATORY_CARE_PROVIDER_SITE_OTHER): Payer: BLUE CROSS/BLUE SHIELD | Admitting: Family Medicine

## 2014-06-26 VITALS — BP 110/80 | HR 88 | Temp 97.7°F | Wt 183.5 lb

## 2014-06-26 DIAGNOSIS — R1115 Cyclical vomiting syndrome unrelated to migraine: Secondary | ICD-10-CM

## 2014-06-26 DIAGNOSIS — E039 Hypothyroidism, unspecified: Secondary | ICD-10-CM

## 2014-06-26 DIAGNOSIS — F1290 Cannabis use, unspecified, uncomplicated: Secondary | ICD-10-CM

## 2014-06-26 DIAGNOSIS — B351 Tinea unguium: Secondary | ICD-10-CM | POA: Insufficient documentation

## 2014-06-26 DIAGNOSIS — Z720 Tobacco use: Secondary | ICD-10-CM

## 2014-06-26 DIAGNOSIS — N529 Male erectile dysfunction, unspecified: Secondary | ICD-10-CM | POA: Insufficient documentation

## 2014-06-26 DIAGNOSIS — F17200 Nicotine dependence, unspecified, uncomplicated: Secondary | ICD-10-CM

## 2014-06-26 DIAGNOSIS — G43A0 Cyclical vomiting, not intractable: Secondary | ICD-10-CM

## 2014-06-26 LAB — COMPREHENSIVE METABOLIC PANEL
ALBUMIN: 3.9 g/dL (ref 3.5–5.2)
ALT: 11 U/L (ref 0–53)
AST: 17 U/L (ref 0–37)
Alkaline Phosphatase: 84 U/L (ref 39–117)
BUN: 13 mg/dL (ref 6–23)
CO2: 26 meq/L (ref 19–32)
CREATININE: 0.8 mg/dL (ref 0.40–1.50)
Calcium: 9.2 mg/dL (ref 8.4–10.5)
Chloride: 105 mEq/L (ref 96–112)
GFR: 109.58 mL/min (ref >60.00)
GLUCOSE: 106 mg/dL — AB (ref 70–99)
POTASSIUM: 4.5 meq/L (ref 3.5–5.1)
SODIUM: 138 meq/L (ref 135–145)
TOTAL PROTEIN: 6.9 g/dL (ref 6.0–8.3)
Total Bilirubin: 0.2 mg/dL (ref 0.2–1.2)

## 2014-06-26 LAB — T4, FREE: FREE T4: 0.53 ng/dL — AB (ref 0.60–1.60)

## 2014-06-26 LAB — TSH: TSH: 4.17 u[IU]/mL (ref 0.35–4.50)

## 2014-06-26 MED ORDER — SILDENAFIL CITRATE 50 MG PO TABS: 25.0000 mg | ORAL_TABLET | Freq: Every day | ORAL | Status: DC | PRN

## 2014-06-26 MED ORDER — THYROID 120 MG PO TABS: 120.0000 mg | ORAL_TABLET | Freq: Every day | ORAL | Status: DC

## 2014-06-26 MED ORDER — EFINACONAZOLE 10 % EX SOLN: 1.0000 "application " | Freq: Every day | CUTANEOUS | Status: DC

## 2014-06-26 NOTE — Progress Notes (Signed)
Pre visit review using our clinic review tool, if applicable. No additional management support is needed unless otherwise documented below in the visit note. 

## 2014-06-26 NOTE — Assessment & Plan Note (Signed)
Continue to encourage cessation. Pt precontemplative.

## 2014-06-26 NOTE — Assessment & Plan Note (Signed)
Check TFTs today and dose accordingly. Pt has 30mg  at home he can take until we call him with results from today.

## 2014-06-26 NOTE — Assessment & Plan Note (Signed)
Discussed options - will send in jublia for him to price out. Otherwise continue funginail. Pt not interested on PO med 2/2 side effects and possible liver toxicity (h/o fatty liver)

## 2014-06-26 NOTE — Progress Notes (Signed)
BP 110/80 mmHg  Pulse 88  Temp(Src) 97.7 F (36.5 C) (Oral)  Wt 183 lb 8 oz (83.235 kg)   CC: 3 mo f/u visit  Subjective:    Patient ID: Malik Perry, male    DOB: [birth year], 49 y.o.   MRN: [id number]  HPI: Malik Perry is a 49 y.o. male presenting on 06/26/2014 for Follow-up   Patient with hypothyroidism and cyclic vomiting syndrome presents for follow up visit.  Had cyclic vomiting flare New Years - but was able to control at home. Lasted 3 days.   Hypothyroid - on armour thyroid 120mg  daily. Denies heat or cold intolerance, constipation. Occasional diarrhea. Hair growth noted under arms and on legs. No unexpected weight changes.   MJ - continues smoking regularly. 7-8 regular cigarettes/day. Smoking outside of home.   R great toe onychomycosis - funginail for last 6 months, hasn't noted improvement. Interested in [place].  ED - Trouble maintaining erection. S/p vasectomy. Denies chest pain or dyspnea.   Relevant past medical, surgical, family and social history reviewed and updated as indicated. Interim medical history since our last visit reviewed. Allergies and medications reviewed and updated. Current Outpatient Prescriptions on File Prior to Visit  Medication Sig  . promethazine (PHENERGAN) 25 MG tablet Take 1 tablet (25 mg total) by mouth every 8 (eight) hours as needed for nausea or vomiting.   No current facility-administered medications on file prior to visit.    Review of Systems Per HPI unless specifically indicated above     Objective:    BP 110/80 mmHg  Pulse 88  Temp(Src) 97.7 F (36.5 C) (Oral)  Wt 183 lb 8 oz (83.235 kg)  Wt Readings from Last 3 Encounters:  06/26/14 183 lb 8 oz (83.235 kg)  03/18/13 159 lb (72.122 kg)  02/15/13 153 lb 8 oz (69.627 kg)    Physical Exam  Constitutional: He appears well-developed and well-nourished. No distress.  HENT:  Mouth/Throat: Oropharynx is clear and moist. No oropharyngeal exudate.  Neck: Normal  range of motion. Neck supple. No thyromegaly present.  Cardiovascular: Normal rate, regular rhythm, normal heart sounds and intact distal pulses.   No murmur heard. Pulmonary/Chest: Effort normal and breath sounds normal. No respiratory distress. He has no wheezes. He has no rales.  Musculoskeletal: He exhibits no edema.  Skin: Skin is warm and dry. No rash noted.  Psychiatric: He has a normal mood and affect.  Nursing note and vitals reviewed.  Results for orders placed or performed in visit on 04/07/13  TSH  Result Value Ref Range   TSH 10.46 (H) 0.35 - 5.50 uIU/mL  T4, Free  Result Value Ref Range   Free T4 0.37 (L) 0.60 - 1.60 ng/dL  T3  Result Value Ref Range   T3, Total 140.4 80.0 - 204.0 ng/dL      Assessment & Plan:   Problem List Items Addressed This Visit    Smoker    Continue to encourage cessation.      Onychomycosis    Discussed options - will send in jublia for him to price out. Otherwise continue funginail. Pt not interested on PO med 2/2 side effects and possible liver toxicity (h/o fatty liver)      Relevant Medications   Efinaconazole (JUBLIA) 10 % SOLN   Hypothyroidism - Primary    Check TFTs today and dose accordingly. Pt has 30mg  at home he can take until we call him with results from today.  Relevant Medications   thyroid (ARMOUR) tablet   Other Relevant Orders   Comprehensive metabolic panel   TSH   T4, free   Erectile dysfunction    Anticipate organic. Pt will trial viagra 25-50mg  prn. Discussed possible side effects and need to avoid nitrates.      Cyclic vomiting syndrome    Stable today.      Cannabis use, uncomplicated    Continue to encourage cessation. Pt precontemplative.          Follow up plan: Return in about 1 year (around 06/27/2015), or as needed, for annual exam, prior fasting for blood work.

## 2014-06-26 NOTE — Patient Instructions (Signed)
Blood work today. I've sent in viagra and jublia for you to price out. I've refilled armour thyroid but don't fill until we get labwork results. Return as needed or in 1 year for physical.

## 2014-06-26 NOTE — Assessment & Plan Note (Signed)
Anticipate organic. Pt will trial viagra 25-50mg  prn. Discussed possible side effects and need to avoid nitrates.

## 2014-06-26 NOTE — Assessment & Plan Note (Signed)
Continue to encourage cessation. 

## 2014-06-26 NOTE — Assessment & Plan Note (Signed)
Stable today.

## 2014-06-27 ENCOUNTER — Other Ambulatory Visit: Payer: Self-pay | Admitting: *Deleted

## 2014-06-28 ENCOUNTER — Telehealth: Payer: Self-pay | Admitting: *Deleted

## 2014-06-28 NOTE — Telephone Encounter (Signed)
PA required for Jublia. Submitted through Cover My Meds. Will await determination.

## 2014-07-05 NOTE — Telephone Encounter (Signed)
plz notify patient - Malik Perry was not approved. rec continue funginail for now Denial in Kims' box.

## 2014-07-05 NOTE — Telephone Encounter (Signed)
Coverage denied. In your IN box for review.

## 2014-07-07 NOTE — Telephone Encounter (Signed)
Message left notifying patient.

## 2014-10-18 NOTE — Telephone Encounter (Signed)
[id number] ° °

## 2014-10-23 ENCOUNTER — Ambulatory Visit (INDEPENDENT_AMBULATORY_CARE_PROVIDER_SITE_OTHER): Payer: BLUE CROSS/BLUE SHIELD | Admitting: Family Medicine

## 2014-10-23 ENCOUNTER — Encounter: Payer: Self-pay | Admitting: Family Medicine

## 2014-10-23 VITALS — BP 98/60 | HR 81 | Temp 98.2°F | Ht 68.75 in | Wt 176.8 lb

## 2014-10-23 DIAGNOSIS — R21 Rash and other nonspecific skin eruption: Secondary | ICD-10-CM | POA: Diagnosis not present

## 2014-10-23 MED ORDER — PERMETHRIN 5 % EX CREA: 1.0000 "application " | TOPICAL_CREAM | Freq: Once | CUTANEOUS | Status: DC

## 2014-10-23 NOTE — Patient Instructions (Addendum)
I wonder about scabies - treat with permethrin cream over night. Oatmeal bath, may continue benadryl. Let us know if not improving with above.  Scabies Scabies are small bugs (mites) that burrow under the skin and cause red bumps and severe itching. These bugs can only be seen with a microscope. Scabies are highly contagious. They can spread easily from person to person by direct contact. They are also spread through sharing clothing or linens that have the scabies mites living in them. It is not unusual for an entire family to become infected through shared towels, clothing, or bedding.  HOME CARE INSTRUCTIONS   Your caregiver may prescribe a cream or lotion to kill the mites. If cream is prescribed, massage the cream into the entire body from the neck to the bottom of both feet. Also massage the cream into the scalp and face if your child is less than 74 year old. Avoid the eyes and mouth. Do not wash your hands after application.  Leave the cream on for 8 to 12 hours. Your child should bathe or shower after the 8 to 12 hour application period. Sometimes it is helpful to apply the cream to your child right before bedtime.  One treatment is usually effective and will eliminate approximately 95% of infestations. For severe cases, your caregiver may decide to repeat the treatment in 1 week. Everyone in your household should be treated with one application of the cream.  New rashes or burrows should not appear within 24 to 48 hours after successful treatment. However, the itching and rash may last for 2 to 4 weeks after successful treatment. Your caregiver may prescribe a medicine to help with the itching or to help the rash go away more quickly.  Scabies can live on clothing or linens for up to 3 days. All of your child's recently used clothing, towels, stuffed toys, and bed linens should be washed in hot water and then dried in a dryer for at least 20 minutes on high heat. Items that cannot be washed  should be enclosed in a plastic bag for at least 3 days.  To help relieve itching, bathe your child in a cool bath or apply cool washcloths to the affected areas.  Your child may return to school after treatment with the prescribed cream. SEEK MEDICAL CARE IF:   The itching persists longer than 4 weeks after treatment.  The rash spreads or becomes infected. Signs of infection include red blisters or yellow-tan crust. Document Released: 05/26/2005 Document Revised: 08/18/2011 Document Reviewed: 10/04/2008 [employer] Patient Information 2015 [place], [place]. This information is not intended to replace advice given to you by your health care provider. Make sure you discuss any questions you have with your health care provider.

## 2014-10-23 NOTE — Assessment & Plan Note (Addendum)
Suspicious for scabies - treat with permethrin cream. Update if not improved with treatment. Ok to continue benadryl prn.

## 2014-10-23 NOTE — Progress Notes (Addendum)
BP 98/60 mmHg  Pulse 81  Temp(Src) 98.2 F (36.8 C) (Oral)  Ht 5' 8.75" (1.746 m)  Wt 176 lb 12.8 oz (80.196 kg)  BMI 26.31 kg/m2  SpO2 98%   CC: skin rash on hands  Subjective:    Patient ID: Malik Perry, male    DOB: 09-21-1965, 49 y.o.   MRN: [id number]  HPI: Malik Perry is a 49 y.o. male presenting on 10/23/2014 for Breakout on both hands   1 wk h/o breakout on hands worse at night time. Very itchy.   Self treating with benadryl, cortisone, calomine lotion.   Wife has been taking blankets of his use to local laundromat.  No new meds, foods, lotions/detergents/soaps/shampoos. No oral lesions.  Did not improve after he stopped tomatoes and chocolate.   Did recently have vomiting/nausea.   Relevant past medical, surgical, family and social history reviewed and updated as indicated. Interim medical history since our last visit reviewed. Allergies and medications reviewed and updated. Current Outpatient Prescriptions on File Prior to Visit  Medication Sig  . promethazine (PHENERGAN) 25 MG tablet Take 1 tablet (25 mg total) by mouth every 8 (eight) hours as needed for nausea or vomiting.  . thyroid (ARMOUR) 120 MG tablet Take 1 tablet (120 mg total) by mouth daily.  . Efinaconazole (JUBLIA) 10 % SOLN Apply 1 application topically daily. (Patient not taking: Reported on 10/23/2014)  . sildenafil (VIAGRA) 50 MG tablet Take 0.5-1 tablets (25-50 mg total) by mouth daily as needed for erectile dysfunction. (Patient not taking: Reported on 10/23/2014)   No current facility-administered medications on file prior to visit.    Review of Systems Per HPI unless specifically indicated above     Objective:    BP 98/60 mmHg  Pulse 81  Temp(Src) 98.2 F (36.8 C) (Oral)  Ht 5' 8.75" (1.746 m)  Wt 176 lb 12.8 oz (80.196 kg)  BMI 26.31 kg/m2  SpO2 98%  Wt Readings from Last 3 Encounters:  10/23/14 176 lb 12.8 oz (80.196 kg)  06/26/14 183 lb 8 oz (83.235 kg)  03/18/13 159  lb (72.122 kg)    Physical Exam  Constitutional: He appears well-developed and well-nourished. No distress.  Skin: Skin is warm and dry. Rash noted.  Papular pruritic rash on palms and dorsal hands as well as some on forearms, some located interdigital web spaces  Nursing note and vitals reviewed.  Results for orders placed or performed in visit on 06/26/14  Comprehensive metabolic panel  Result Value Ref Range   Sodium 138 135 - 145 mEq/L   Potassium 4.5 3.5 - 5.1 mEq/L   Chloride 105 96 - 112 mEq/L   CO2 26 19 - 32 mEq/L   Glucose, Bld 106 (H) 70 - 99 mg/dL   BUN 13 6 - 23 mg/dL   Creatinine, Ser 0.80 0.40 - 1.50 mg/dL   Total Bilirubin 0.2 0.2 - 1.2 mg/dL   Alkaline Phosphatase 84 39 - 117 U/L   AST 17 0 - 37 U/L   ALT 11 0 - 53 U/L   Total Protein 6.9 6.0 - 8.3 g/dL   Albumin 3.9 3.5 - 5.2 g/dL   Calcium 9.2 8.4 - 10.5 mg/dL   GFR 109.58 >60.00 mL/min  TSH  Result Value Ref Range   TSH 4.17 0.35 - 4.50 uIU/mL  T4, free  Result Value Ref Range   Free T4 0.53 (L) 0.60 - 1.60 ng/dL      Assessment & Plan:  Problem List Items Addressed This Visit    Skin rash - Primary    Suspicious for scabies - treat with permethrin cream. Update if not improved with treatment. Ok to continue benadryl prn.          Follow up plan: Return if symptoms worsen or fail to improve.

## 2014-10-23 NOTE — Progress Notes (Signed)
Pre visit review using our clinic review tool, if applicable. No additional management support is needed unless otherwise documented below in the visit note. 

## 2015-06-14 ENCOUNTER — Other Ambulatory Visit: Payer: Self-pay | Admitting: Family Medicine

## 2015-07-18 ENCOUNTER — Other Ambulatory Visit: Payer: Self-pay | Admitting: Family Medicine

## 2015-07-18 DIAGNOSIS — E039 Hypothyroidism, unspecified: Secondary | ICD-10-CM

## 2015-07-19 NOTE — Telephone Encounter (Signed)
No TSH labs in over a year and no recent appt. with you, please advise

## 2015-07-20 NOTE — Telephone Encounter (Signed)
.  left message to have patient return my call.  

## 2015-07-20 NOTE — Telephone Encounter (Signed)
Refilled x36mo. plz schedule lab visit to check TFTs

## 2015-10-01 ENCOUNTER — Other Ambulatory Visit: Payer: Self-pay | Admitting: Family Medicine

## 2015-10-03 ENCOUNTER — Encounter: Payer: Self-pay | Admitting: Family Medicine

## 2015-10-03 ENCOUNTER — Ambulatory Visit (INDEPENDENT_AMBULATORY_CARE_PROVIDER_SITE_OTHER): Payer: BLUE CROSS/BLUE SHIELD | Admitting: Family Medicine

## 2015-10-03 VITALS — BP 132/78 | HR 76 | Temp 97.4°F | Wt 187.5 lb

## 2015-10-03 DIAGNOSIS — N529 Male erectile dysfunction, unspecified: Secondary | ICD-10-CM

## 2015-10-03 DIAGNOSIS — Z720 Tobacco use: Secondary | ICD-10-CM | POA: Diagnosis not present

## 2015-10-03 DIAGNOSIS — F17200 Nicotine dependence, unspecified, uncomplicated: Secondary | ICD-10-CM

## 2015-10-03 DIAGNOSIS — F1290 Cannabis use, unspecified, uncomplicated: Secondary | ICD-10-CM | POA: Diagnosis not present

## 2015-10-03 DIAGNOSIS — R1115 Cyclical vomiting syndrome unrelated to migraine: Secondary | ICD-10-CM

## 2015-10-03 DIAGNOSIS — G43A0 Cyclical vomiting, not intractable: Secondary | ICD-10-CM

## 2015-10-03 DIAGNOSIS — E039 Hypothyroidism, unspecified: Secondary | ICD-10-CM | POA: Diagnosis not present

## 2015-10-03 LAB — BASIC METABOLIC PANEL
BUN: 13 mg/dL (ref 6–23)
CALCIUM: 9.9 mg/dL (ref 8.4–10.5)
CO2: 29 meq/L (ref 19–32)
Chloride: 104 mEq/L (ref 96–112)
Creatinine, Ser: 0.96 mg/dL (ref 0.40–1.50)
GFR: 88.32 mL/min (ref >60.00)
Glucose, Bld: 109 mg/dL — ABNORMAL HIGH (ref 70–99)
POTASSIUM: 4.9 meq/L (ref 3.5–5.1)
SODIUM: 139 meq/L (ref 135–145)

## 2015-10-03 LAB — T4, FREE: FREE T4: 0.78 ng/dL (ref 0.60–1.60)

## 2015-10-03 LAB — T3: T3 TOTAL: 296 ng/dL — AB (ref 76–181)

## 2015-10-03 LAB — TSH: TSH: 0.2 u[IU]/mL — AB (ref 0.35–4.50)

## 2015-10-03 MED ORDER — SILDENAFIL CITRATE 100 MG PO TABS
50.0000 mg | ORAL_TABLET | ORAL | Status: DC | PRN
Start: 2015-10-03 — End: 2016-08-06

## 2015-10-03 NOTE — Assessment & Plan Note (Signed)
Refilled viagra 50-100mg  prn - working well.

## 2015-10-03 NOTE — Progress Notes (Signed)
Pre visit review using our clinic review tool, if applicable. No additional management support is needed unless otherwise documented below in the visit note. 

## 2015-10-03 NOTE — Assessment & Plan Note (Signed)
Check TFTs, dose armour thyroid accordingly

## 2015-10-03 NOTE — Assessment & Plan Note (Signed)
Continue to encourage cessation. precontemplative. 

## 2015-10-03 NOTE — Assessment & Plan Note (Signed)
Stable period - last flare prior to 2016.

## 2015-10-03 NOTE — Progress Notes (Signed)
BP 132/78 mmHg  Pulse 76  Temp(Src) 97.4 F (36.3 C) (Oral)  Wt 187 lb 8 oz (85.049 kg)   CC: med refill visit  Subjective:    Patient ID: Malik Perry, male    DOB: 09/22/1965, 50 y.o.   MRN: [id number]  HPI: Malik Perry is a 50 y.o. male presenting on 10/03/2015 for Medication Refill   Last seen here 10/2014 for skin rash and 06/2014 for routine visit.   Hypothyroid - on armour thyroid 120mg  daily. Declined levothyroxine. Staying fatigued. No uexpected weight changes (gained 10 lbs, after he started sodas), diarrhea/constipation, heat/cold intolerance, skin or hair changes.   Cyclic vomiting syndrome - stable period. No flare over last 1.5 yrs.   ED - viagra working well.   Continued MJ smoker and tobacco smoker 1/2 ppd. Quit early 2017 x2 wks.   Relevant past medical, surgical, family and social history reviewed and updated as indicated. Interim medical history since our last visit reviewed. Allergies and medications reviewed and updated. Current Outpatient Prescriptions on File Prior to Visit  Medication Sig  . ARMOUR THYROID 120 MG tablet TAKE 1 TABLET (120 MG TOTAL) BY MOUTH DAILY.  Marland Kitchen promethazine (PHENERGAN) 25 MG tablet Take 1 tablet (25 mg total) by mouth every 8 (eight) hours as needed for nausea or vomiting.   No current facility-administered medications on file prior to visit.    Review of Systems Per HPI unless specifically indicated in ROS section     Objective:    BP 132/78 mmHg  Pulse 76  Temp(Src) 97.4 F (36.3 C) (Oral)  Wt 187 lb 8 oz (85.049 kg)  Wt Readings from Last 3 Encounters:  10/03/15 187 lb 8 oz (85.049 kg)  10/23/14 176 lb 12.8 oz (80.196 kg)  06/26/14 183 lb 8 oz (83.235 kg)    Physical Exam  Constitutional: He appears well-developed and well-nourished. No distress.  HENT:  Mouth/Throat: Oropharynx is clear and moist. No oropharyngeal exudate.  Eyes: Conjunctivae and EOM are normal. Pupils are equal, round, and reactive to  light. No scleral icterus.  Neck: Normal range of motion. Neck supple. No thyromegaly present.  Cardiovascular: Normal rate, regular rhythm, normal heart sounds and intact distal pulses.   No murmur heard. Musculoskeletal: He exhibits no edema.  Lymphadenopathy:    He has no cervical adenopathy.  Psychiatric: He has a normal mood and affect.  Nursing note and vitals reviewed.  Results for orders placed or performed in visit on 06/26/14  Comprehensive metabolic panel  Result Value Ref Range   Sodium 138 135 - 145 mEq/L   Potassium 4.5 3.5 - 5.1 mEq/L   Chloride 105 96 - 112 mEq/L   CO2 26 19 - 32 mEq/L   Glucose, Bld 106 (H) 70 - 99 mg/dL   BUN 13 6 - 23 mg/dL   Creatinine, Ser 0.80 0.40 - 1.50 mg/dL   Total Bilirubin 0.2 0.2 - 1.2 mg/dL   Alkaline Phosphatase 84 39 - 117 U/L   AST 17 0 - 37 U/L   ALT 11 0 - 53 U/L   Total Protein 6.9 6.0 - 8.3 g/dL   Albumin 3.9 3.5 - 5.2 g/dL   Calcium 9.2 8.4 - 10.5 mg/dL   GFR 109.58 >60.00 mL/min  TSH  Result Value Ref Range   TSH 4.17 0.35 - 4.50 uIU/mL  T4, free  Result Value Ref Range   Free T4 0.53 (L) 0.60 - 1.60 ng/dL      Assessment &  Plan:   Problem List Items Addressed This Visit    Smoker    Continue to encourage cessation. precontemplative      Hypothyroidism - Primary    Check TFTs, dose armour thyroid accordingly      Relevant Orders   Basic metabolic panel   TSH   T3   T4, free   Cannabis use, uncomplicated    Continue to encourage cessation      Cyclic vomiting syndrome    Stable period - last flare prior to 2016.      Erectile dysfunction    Refilled viagra 50-100mg  prn - working well.          Follow up plan: Return in about 1 year (around 10/02/2016), or as needed, for annual exam, prior fasting for blood work.  Ria Bush, MD

## 2015-10-03 NOTE — Assessment & Plan Note (Signed)
Continue to encourage cessation. 

## 2015-10-03 NOTE — Patient Instructions (Addendum)
Labs today.  Return in 1 year for next physical. We will refill armour thyroid, make changes to dose if needed.

## 2015-10-06 ENCOUNTER — Other Ambulatory Visit: Payer: Self-pay | Admitting: Family Medicine

## 2015-10-06 MED ORDER — THYROID 30 MG PO TABS: 30.0000 mg | ORAL_TABLET | ORAL | Status: DC

## 2015-10-06 MED ORDER — THYROID 90 MG PO TABS: 90.0000 mg | ORAL_TABLET | Freq: Every day | ORAL | Status: DC

## 2015-12-12 ENCOUNTER — Other Ambulatory Visit (INDEPENDENT_AMBULATORY_CARE_PROVIDER_SITE_OTHER): Payer: Managed Care, Other (non HMO)

## 2015-12-12 DIAGNOSIS — E039 Hypothyroidism, unspecified: Secondary | ICD-10-CM | POA: Diagnosis not present

## 2015-12-12 LAB — T4, FREE: FREE T4: 0.6 ng/dL (ref 0.60–1.60)

## 2015-12-12 LAB — T3: T3 TOTAL: 156 ng/dL (ref 76–181)

## 2015-12-12 LAB — TSH: TSH: 0.64 u[IU]/mL (ref 0.35–4.50)

## 2015-12-13 ENCOUNTER — Encounter: Payer: Self-pay | Admitting: *Deleted

## 2015-12-13 ENCOUNTER — Other Ambulatory Visit: Payer: Self-pay | Admitting: Family Medicine

## 2015-12-13 MED ORDER — THYROID 30 MG PO TABS: 30.0000 mg | ORAL_TABLET | ORAL | Status: DC

## 2015-12-24 ENCOUNTER — Other Ambulatory Visit: Payer: Self-pay | Admitting: Family Medicine

## 2016-06-16 ENCOUNTER — Other Ambulatory Visit: Payer: Self-pay | Admitting: Family Medicine

## 2016-08-06 ENCOUNTER — Other Ambulatory Visit: Payer: Self-pay | Admitting: Family Medicine

## 2016-08-12 ENCOUNTER — Telehealth: Payer: Self-pay

## 2016-08-12 NOTE — Telephone Encounter (Signed)
Pt wanted to know why if the doctor ordered # 9 on sildenafil why he only was allowed to get # 2 the last time pt picked up med. I spoke with Nicki Reaper at [facility] and he said pts ins has limitation of # 6 every 25 days. Pt last picked up # 2 on 08/06/16 and pt can call [facility] around 08/20/16 for more sildenafil. Pt voiced understanding.

## 2016-11-03 ENCOUNTER — Other Ambulatory Visit: Payer: Self-pay | Admitting: Family Medicine

## 2016-11-10 ENCOUNTER — Other Ambulatory Visit: Payer: Self-pay | Admitting: Family Medicine

## 2016-11-10 NOTE — Telephone Encounter (Signed)
Letter mailed requesting pt schedule CPX.

## 2016-12-02 ENCOUNTER — Other Ambulatory Visit: Payer: Self-pay | Admitting: Family Medicine

## 2016-12-29 ENCOUNTER — Other Ambulatory Visit: Payer: Self-pay | Admitting: Family Medicine

## 2017-02-01 ENCOUNTER — Other Ambulatory Visit: Payer: Self-pay | Admitting: Family Medicine

## 2017-03-19 ENCOUNTER — Encounter: Payer: Self-pay | Admitting: Family Medicine

## 2017-03-19 ENCOUNTER — Ambulatory Visit (INDEPENDENT_AMBULATORY_CARE_PROVIDER_SITE_OTHER): Payer: BLUE CROSS/BLUE SHIELD | Admitting: Family Medicine

## 2017-03-19 VITALS — BP 118/66 | HR 71 | Temp 97.6°F | Ht 68.75 in | Wt 179.5 lb

## 2017-03-19 DIAGNOSIS — K760 Fatty (change of) liver, not elsewhere classified: Secondary | ICD-10-CM | POA: Diagnosis not present

## 2017-03-19 DIAGNOSIS — F1290 Cannabis use, unspecified, uncomplicated: Secondary | ICD-10-CM | POA: Diagnosis not present

## 2017-03-19 DIAGNOSIS — F17200 Nicotine dependence, unspecified, uncomplicated: Secondary | ICD-10-CM

## 2017-03-19 DIAGNOSIS — Z1211 Encounter for screening for malignant neoplasm of colon: Secondary | ICD-10-CM

## 2017-03-19 DIAGNOSIS — Z125 Encounter for screening for malignant neoplasm of prostate: Secondary | ICD-10-CM

## 2017-03-19 DIAGNOSIS — E039 Hypothyroidism, unspecified: Secondary | ICD-10-CM

## 2017-03-19 DIAGNOSIS — E785 Hyperlipidemia, unspecified: Secondary | ICD-10-CM | POA: Diagnosis not present

## 2017-03-19 DIAGNOSIS — Z0000 Encounter for general adult medical examination without abnormal findings: Secondary | ICD-10-CM | POA: Insufficient documentation

## 2017-03-19 MED ORDER — THYROID 90 MG PO TABS: 90.0000 mg | ORAL_TABLET | Freq: Every day | ORAL | 11 refills | Status: DC

## 2017-03-19 MED ORDER — THYROID 30 MG PO TABS: 30.0000 mg | ORAL_TABLET | ORAL | 11 refills | Status: DC

## 2017-03-19 NOTE — Assessment & Plan Note (Signed)
Has done well on armour thyroid but ran out this week. Will defer TFT testing for 1 month. rtc lab visit only at that time. He agrees with plan.

## 2017-03-19 NOTE — Assessment & Plan Note (Signed)
Update FLP.  

## 2017-03-19 NOTE — Patient Instructions (Addendum)
Return in 1 month for labs only.  We will refer you for colonoscopy after the new year.  You are doing well today.   Health Maintenance, Male A healthy lifestyle and preventive care is important for your health and wellness. Ask your health care provider about what schedule of regular examinations is right for you. What should I know about weight and diet? Eat a Healthy Diet  Eat plenty of vegetables, fruits, whole grains, low-fat dairy products, and lean protein.  Do not eat a lot of foods high in solid fats, added sugars, or salt.  Maintain a Healthy Weight Regular exercise can help you achieve or maintain a healthy weight. You should:  Do at least 150 minutes of exercise each week. The exercise should increase your heart rate and make you sweat (moderate-intensity exercise).  Do strength-training exercises at least twice a week.  Watch Your Levels of Cholesterol and Blood Lipids  Have your blood tested for lipids and cholesterol every 5 years starting at 51 years of age. If you are at high risk for heart disease, you should start having your blood tested when you are 51 years old. You may need to have your cholesterol levels checked more often if: ? Your lipid or cholesterol levels are high. ? You are older than 51 years of age. ? You are at high risk for heart disease.  What should I know about cancer screening? Many types of cancers can be detected early and may often be prevented. Lung Cancer  You should be screened every year for lung cancer if: ? You are a current smoker who has smoked for at least 30 years. ? You are a former smoker who has quit within the past 15 years.  Talk to your health care provider about your screening options, when you should start screening, and how often you should be screened.  Colorectal Cancer  Routine colorectal cancer screening usually begins at 51 years of age and should be repeated every 5-10 years until you are 51 years old. You may  need to be screened more often if early forms of precancerous polyps or small growths are found. Your health care provider may recommend screening at an earlier age if you have risk factors for colon cancer.  Your health care provider may recommend using home test kits to check for hidden blood in the stool.  A small camera at the end of a tube can be used to examine your colon (sigmoidoscopy or colonoscopy). This checks for the earliest forms of colorectal cancer.  Prostate and Testicular Cancer  Depending on your age and overall health, your health care provider may do certain tests to screen for prostate and testicular cancer.  Talk to your health care provider about any symptoms or concerns you have about testicular or prostate cancer.  Skin Cancer  Check your skin from head to toe regularly.  Tell your health care provider about any new moles or changes in moles, especially if: ? There is a change in a mole's size, shape, or color. ? You have a mole that is larger than a pencil eraser.  Always use sunscreen. Apply sunscreen liberally and repeat throughout the day.  Protect yourself by wearing long sleeves, pants, a wide-brimmed hat, and sunglasses when outside.  What should I know about heart disease, diabetes, and high blood pressure?  If you are 22-55 years of age, have your blood pressure checked every 3-5 years. If you are 48 years of age or older,  have your blood pressure checked every year. You should have your blood pressure measured twice-once when you are at a hospital or clinic, and once when you are not at a hospital or clinic. Record the average of the two measurements. To check your blood pressure when you are not at a hospital or clinic, you can use: ? An automated blood pressure machine at a pharmacy. ? A home blood pressure monitor.  Talk to your health care provider about your target blood pressure.  If you are between 48-48 years old, ask your health care  provider if you should take aspirin to prevent heart disease.  Have regular diabetes screenings by checking your fasting blood sugar level. ? If you are at a normal weight and have a low risk for diabetes, have this test once every three years after the age of 61. ? If you are overweight and have a high risk for diabetes, consider being tested at a younger age or more often.  A one-time screening for abdominal aortic aneurysm (AAA) by ultrasound is recommended for men aged 69-75 years who are current or former smokers. What should I know about preventing infection? Hepatitis B If you have a higher risk for hepatitis B, you should be screened for this virus. Talk with your health care provider to find out if you are at risk for hepatitis B infection. Hepatitis C Blood testing is recommended for:  Everyone born from [birth year] through 1965.  Anyone with known risk factors for hepatitis C.  Sexually Transmitted Diseases (STDs)  You should be screened each year for STDs including gonorrhea and chlamydia if: ? You are sexually active and are younger than 51 years of age. ? You are older than 50 years of age and your health care provider tells you that you are at risk for this type of infection. ? Your sexual activity has changed since you were last screened and you are at an increased risk for chlamydia or gonorrhea. Ask your health care provider if you are at risk.  Talk with your health care provider about whether you are at high risk of being infected with HIV. Your health care provider may recommend a prescription medicine to help prevent HIV infection.  What else can I do?  Schedule regular health, dental, and eye exams.  Stay current with your vaccines (immunizations).  Do not use any tobacco products, such as cigarettes, chewing tobacco, and e-cigarettes. If you need help quitting, ask your health care provider.  Limit alcohol intake to no more than 2 drinks per day. One drink equals 12  ounces of beer, 5 ounces of wine, or 1 ounces of hard liquor.  Do not use street drugs.  Do not share needles.  Ask your health care provider for help if you need support or information about quitting drugs.  Tell your health care provider if you often feel depressed.  Tell your health care provider if you have ever been abused or do not feel safe at home. This information is not intended to replace advice given to you by your health care provider. Make sure you discuss any questions you have with your health care provider. Document Released: 11/22/2007 Document Revised: 01/23/2016 Document Reviewed: 02/27/2015 Elsevier Interactive Patient Education  [employer].

## 2017-03-19 NOTE — Assessment & Plan Note (Signed)
Update LFTs next labwork.

## 2017-03-19 NOTE — Progress Notes (Signed)
BP 118/66 (BP Location: Left Arm, Patient Position: Sitting, Cuff Size: Normal)   Pulse 71   Temp 97.6 F (36.4 C) (Oral)   Ht 5' 8.75" (1.746 m)   Wt 179 lb 8 oz (81.4 kg)   SpO2 98%   BMI 26.70 kg/m    CC: CPE Subjective:    Patient ID: Malik Perry, male    DOB: 05/06/1966, 51 y.o.   MRN: [id number]  HPI: Malik Perry is a 51 y.o. male presenting on 03/19/2017 for Annual Exam (Same day labs. Did not take 30 mcg of thyroid med 10/8 or 10/10. And did not take 90 mcg thyroid med this morning)   Hypothyroidism - ran out of 30mg  dose this week, ran out of 90mg  dose today. Regular dose is 90mg  daily, 120mg  MWF. Will return for labs. No hypo or hyperthyroid symptoms.  Cyclic vomiting - 1 flare in the past year, treats with phenergan prn, very stable period.   Preventative: Colon cancer screening - discussed, would like colonoscopy.  Prostate screening - discussed. Agrees to PSA, declines DRE.  Flu declines Tdap 2014 Seat belt use discussed Sunscreen use discussed. No changing moles on skin.  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 Interim medical history since our last visit reviewed. Allergies and medications reviewed and updated. Outpatient Medications Prior to Visit  Medication Sig Dispense Refill  . promethazine (PHENERGAN) 25 MG tablet Take 1 tablet (25 mg total) by mouth every 8 (eight) hours as needed for nausea or vomiting. 20 tablet 0  . sildenafil (VIAGRA) 100 MG tablet TAKE 0.5-1 TABLETS (50-100 MG TOTAL) BY MOUTH AS NEEDED FOR ERECTILE DYSFUNCTION. 9 tablet 1  . ARMOUR THYROID 30 MG tablet  TAKE 1 TABLET BY MOUTH EVERY MONDAY, WEDNESDAY, AND FRIDAY WITH 90MG  DOSE FOR TOTAL 120MG  20 tablet 2  . ARMOUR THYROID 90 MG tablet TAKE 1 TABLET (90 MG TOTAL) BY MOUTH DAILY BEFORE BREAKFAST. MUST SCHEDULE OFFICE VISIT 30 tablet 0  . thyroid (ARMOUR THYROID) 30 MG tablet Take 1 tablet (30 mg total) by mouth every Monday, Wednesday, and Friday. With 90mg  dose for total 120mg  20 tablet 9  . thyroid (ARMOUR) 90 MG tablet Take 1 tablet (90 mg total) by mouth daily. 30 min before breakfast on empty stomach 30 tablet 11   No facility-administered medications prior to visit.      Per HPI unless specifically indicated in ROS section below Review of Systems  Constitutional: Negative for activity change, appetite change, chills, fatigue, fever and unexpected weight change.  HENT: Negative for hearing loss.   Eyes: Negative for visual disturbance.  Respiratory: Positive for shortness of breath (attributed to smoking). Negative for cough, chest tightness and wheezing.   Cardiovascular: Negative for chest pain, palpitations and leg swelling.  Gastrointestinal: Negative for abdominal distention, abdominal pain, blood in stool, constipation, diarrhea, nausea and vomiting.  Genitourinary: Negative for difficulty urinating and hematuria.  Musculoskeletal: Negative for arthralgias, myalgias and neck pain.  Skin: Negative for rash.  Neurological: Negative for dizziness, seizures, syncope and headaches.  Hematological: Negative for adenopathy. Does not bruise/bleed easily.  Psychiatric/Behavioral: Negative for dysphoric mood. The patient is  not nervous/anxious.       Objective:    BP 118/66 (BP Location: Left Arm, Patient Position: Sitting, Cuff Size: Normal)   Pulse 71   Temp 97.6 F (36.4 C) (Oral)   Ht 5' 8.75" (1.746 m)   Wt 179 lb 8 oz (81.4 kg)   SpO2 98%   BMI 26.70 kg/m   Wt Readings from Last 3 Encounters:  03/19/17 179 lb 8 oz (81.4 kg)  10/03/15 187 lb 8 oz (85 kg)  10/23/14 176 lb  12.8 oz (80.2 kg)    Physical Exam  Constitutional: He is oriented to person, place, and time. He appears well-developed and well-nourished. No distress.  HENT:  Head: Normocephalic and atraumatic.  Right Ear: Hearing, tympanic membrane, external ear and ear canal normal.  Left Ear: Hearing, tympanic membrane, external ear and ear canal normal.  Nose: Nose normal.  Mouth/Throat: Uvula is midline, oropharynx is clear and moist and mucous membranes are normal. No oropharyngeal exudate, posterior oropharyngeal edema or posterior oropharyngeal erythema.  Eyes: Pupils are equal, round, and reactive to light. Conjunctivae and EOM are normal. No scleral icterus.  Neck: Normal range of motion. Neck supple.  Cardiovascular: Normal rate, regular rhythm, normal heart sounds and intact distal pulses.   No murmur heard. Pulses:      Radial pulses are 2+ on the right side, and 2+ on the left side.  Pulmonary/Chest: Effort normal and breath sounds normal. No respiratory distress. He has no wheezes. He has no rales.  Abdominal: Soft. Bowel sounds are normal. He exhibits no distension and no mass. There is no tenderness. There is no rebound and no guarding.  Musculoskeletal: Normal range of motion. He exhibits no edema.  Lymphadenopathy:    He has no cervical adenopathy.  Neurological: He is alert and oriented to person, place, and time.  CN grossly intact, station and gait intact  Skin: Skin is warm and dry. No rash noted.  Psychiatric: He has a normal mood and affect. His behavior is normal. Judgment and thought content normal.  Nursing note and vitals reviewed.  Results for orders placed or performed in visit on 12/12/15  T3  Result Value Ref Range   T3, Total 156.0 76 - 181 ng/dL  T4, free  Result Value Ref Range   Free T4 0.60 0.60 - 1.60 ng/dL  TSH  Result Value Ref Range   TSH 0.64 0.35 - 4.50 uIU/mL      Assessment & Plan:   Problem List Items Addressed This Visit    Cannabis use,  uncomplicated   Dyslipidemia    Update FLP       Fatty liver    Update LFTs next labwork.       Relevant Orders   Lipid panel   Comprehensive metabolic panel   Health maintenance examination - Primary    Preventative protocols reviewed and updated unless pt declined. Discussed healthy diet and lifestyle.       Hypothyroidism    Has done well on armour thyroid but ran out this week. Will defer TFT testing for 1 month. rtc lab visit only at that time. He agrees with plan.       Relevant Medications   thyroid (ARMOUR THYROID) 90 MG tablet   thyroid (ARMOUR THYROID) 30 MG tablet (Start on 03/20/2017)   Other Relevant Orders   TSH   T4, free   Smoker    Continue to encourage cessation. precontemplative.        Other  Visit Diagnoses    Special screening for malignant neoplasms, colon       Relevant Orders   Ambulatory referral to Gastroenterology   Special screening for malignant neoplasm of prostate       Relevant Orders   PSA       Follow up plan: Return in about 1 year (around 03/19/2018) for annual exam, prior fasting for blood work.  Ria Bush, MD

## 2017-03-19 NOTE — Assessment & Plan Note (Addendum)
Continue to encourage cessation. precontemplative. 

## 2017-03-19 NOTE — Assessment & Plan Note (Signed)
Preventative protocols reviewed and updated unless pt declined. Discussed healthy diet and lifestyle.  

## 2017-03-20 ENCOUNTER — Other Ambulatory Visit: Payer: Self-pay | Admitting: Family Medicine

## 2017-03-20 NOTE — Telephone Encounter (Signed)
Pt left v/m requesting refill viagra 03/20/17.[facility]. CPX on 03/19/17.

## 2017-03-20 NOTE — Telephone Encounter (Signed)
Last filled:  11/12/16, #9 Last OV (CPE): yesterday, 03/19/17 Next OV:  03/23/18  How many refills did you want to allow?

## 2017-04-20 ENCOUNTER — Other Ambulatory Visit: Payer: BLUE CROSS/BLUE SHIELD

## 2017-04-23 ENCOUNTER — Encounter: Payer: Self-pay | Admitting: Family Medicine

## 2017-04-27 ENCOUNTER — Other Ambulatory Visit (INDEPENDENT_AMBULATORY_CARE_PROVIDER_SITE_OTHER): Payer: BLUE CROSS/BLUE SHIELD

## 2017-04-27 ENCOUNTER — Encounter: Payer: Self-pay | Admitting: Internal Medicine

## 2017-04-27 DIAGNOSIS — E039 Hypothyroidism, unspecified: Secondary | ICD-10-CM | POA: Diagnosis not present

## 2017-04-27 DIAGNOSIS — K760 Fatty (change of) liver, not elsewhere classified: Secondary | ICD-10-CM | POA: Diagnosis not present

## 2017-04-27 DIAGNOSIS — Z125 Encounter for screening for malignant neoplasm of prostate: Secondary | ICD-10-CM | POA: Diagnosis not present

## 2017-04-27 LAB — TSH: TSH: 2.92 u[IU]/mL (ref 0.35–4.50)

## 2017-04-27 LAB — COMPREHENSIVE METABOLIC PANEL
ALBUMIN: 4.1 g/dL (ref 3.5–5.2)
ALK PHOS: 69 U/L (ref 39–117)
ALT: 12 U/L (ref 0–53)
AST: 18 U/L (ref 0–37)
BUN: 10 mg/dL (ref 6–23)
CALCIUM: 9.4 mg/dL (ref 8.4–10.5)
CHLORIDE: 105 meq/L (ref 96–112)
CO2: 30 mEq/L (ref 19–32)
CREATININE: 0.99 mg/dL (ref 0.40–1.50)
GFR: 84.7 mL/min (ref >60.00)
Glucose, Bld: 109 mg/dL — ABNORMAL HIGH (ref 70–99)
POTASSIUM: 5.2 meq/L — AB (ref 3.5–5.1)
Sodium: 140 mEq/L (ref 135–145)
TOTAL PROTEIN: 6.7 g/dL (ref 6.0–8.3)
Total Bilirubin: 0.6 mg/dL (ref 0.2–1.2)

## 2017-04-27 LAB — LIPID PANEL
CHOLESTEROL: 153 mg/dL (ref 0–200)
HDL: 36.9 mg/dL — ABNORMAL LOW (ref >39.00)
LDL Cholesterol: 102 mg/dL — ABNORMAL HIGH (ref 0–99)
NONHDL: 116.57
TRIGLYCERIDES: 72 mg/dL (ref 0.0–149.0)
Total CHOL/HDL Ratio: 4
VLDL: 14.4 mg/dL (ref 0.0–40.0)

## 2017-04-27 LAB — PSA: PSA: 0.71 ng/mL (ref 0.10–4.00)

## 2017-04-27 LAB — T4, FREE: Free T4: 0.63 ng/dL (ref 0.60–1.60)

## 2017-06-09 HISTORY — PX: COLONOSCOPY: SHX174

## 2017-06-17 ENCOUNTER — Other Ambulatory Visit: Payer: Self-pay

## 2017-06-17 ENCOUNTER — Ambulatory Visit (AMBULATORY_SURGERY_CENTER): Payer: Self-pay | Admitting: *Deleted

## 2017-06-17 VITALS — Ht 70.0 in | Wt 187.0 lb

## 2017-06-17 DIAGNOSIS — Z1211 Encounter for screening for malignant neoplasm of colon: Secondary | ICD-10-CM

## 2017-06-17 NOTE — Progress Notes (Signed)
No egg or soy allergy known to patient  No past sedation with any surgeries  or procedures, no intubation problems - had gas with wisdom teeth  No diet pills per patient No home 02 use per patient  No blood thinners per patient  Pt denies issues with constipation  No A fib or A flutter  EMMI video sent to pt's e mail - pt declined

## 2017-06-23 ENCOUNTER — Encounter: Payer: Self-pay | Admitting: Internal Medicine

## 2017-07-01 ENCOUNTER — Other Ambulatory Visit: Payer: Self-pay

## 2017-07-01 ENCOUNTER — Encounter: Payer: Self-pay | Admitting: Internal Medicine

## 2017-07-01 ENCOUNTER — Ambulatory Visit (AMBULATORY_SURGERY_CENTER): Payer: BLUE CROSS/BLUE SHIELD | Admitting: Internal Medicine

## 2017-07-01 VITALS — BP 112/72 | HR 62 | Temp 97.3°F | Resp 16 | Ht 70.0 in | Wt 187.0 lb

## 2017-07-01 DIAGNOSIS — D129 Benign neoplasm of anus and anal canal: Secondary | ICD-10-CM

## 2017-07-01 DIAGNOSIS — D128 Benign neoplasm of rectum: Secondary | ICD-10-CM

## 2017-07-01 DIAGNOSIS — D125 Benign neoplasm of sigmoid colon: Secondary | ICD-10-CM | POA: Diagnosis not present

## 2017-07-01 DIAGNOSIS — Z1211 Encounter for screening for malignant neoplasm of colon: Secondary | ICD-10-CM

## 2017-07-01 DIAGNOSIS — Z1212 Encounter for screening for malignant neoplasm of rectum: Secondary | ICD-10-CM

## 2017-07-01 DIAGNOSIS — K621 Rectal polyp: Secondary | ICD-10-CM

## 2017-07-01 DIAGNOSIS — D122 Benign neoplasm of ascending colon: Secondary | ICD-10-CM | POA: Diagnosis not present

## 2017-07-01 DIAGNOSIS — D123 Benign neoplasm of transverse colon: Secondary | ICD-10-CM | POA: Diagnosis not present

## 2017-07-01 MED ORDER — SODIUM CHLORIDE 0.9 % IV SOLN: 500.0000 mL | Freq: Once | INTRAVENOUS | Status: DC

## 2017-07-01 NOTE — Progress Notes (Signed)
Pt's states no medical or surgical changes since previsit or office visit. 

## 2017-07-01 NOTE — Op Note (Signed)
[facility] Patient Name: Malik Perry Procedure Date: 07/01/2017 9:37 AM MRN: [id number] Endoscopist: Gatha Mayer , MD Age: 52 Referring MD:  Date of Birth: 1965-11-17 Gender: Male Account #: [id number] Procedure:                Colonoscopy Indications:              Screening for colorectal malignant neoplasm, This                            is the patient's first colonoscopy Medicines:                Propofol per Anesthesia, Monitored Anesthesia Care Procedure:                Pre-Anesthesia Assessment:                           - Prior to the procedure, a History and Physical                            was performed, and patient medications and                            allergies were reviewed. The patient's tolerance of                            previous anesthesia was also reviewed. The risks                            and benefits of the procedure and the sedation                            options and risks were discussed with the patient.                            All questions were answered, and informed consent                            was obtained. Prior Anticoagulants: The patient has                            taken no previous anticoagulant or antiplatelet                            agents. ASA Grade Assessment: II - A patient with                            mild systemic disease. After reviewing the risks                            and benefits, the patient was deemed in                            satisfactory condition to undergo the procedure.  After obtaining informed consent, the colonoscope                            was passed under direct vision. Throughout the                            procedure, the patient's blood pressure, pulse, and                            oxygen saturations were monitored continuously. The                            Colonoscope was introduced through the anus and   advanced to the the cecum, identified by                            appendiceal orifice and ileocecal valve. The                            colonoscopy was performed without difficulty. The                            patient tolerated the procedure well. The quality                            of the bowel preparation was excellent. The                            ileocecal valve, appendiceal orifice, and rectum                            were photographed. Scope In: 9:44:53 AM Scope Out: 10:00:04 AM Scope Withdrawal Time: 0 hours 13 minutes 37 seconds  Total Procedure Duration: 0 hours 15 minutes 11 seconds  Findings:                 The perianal and digital rectal examinations were                            normal. Pertinent negatives include normal prostate                            (size, shape, and consistency).                           Four sessile and semi-pedunculated polyps were                            found in the rectum and sigmoid colon. The polyps                            were 3 to 6 mm in size. These polyps were removed                            with a cold snare. Resection  and retrieval were                            complete. Verification of patient identification                            for the specimen was done. Estimated blood loss was                            minimal.                           A 2 mm polyp was found in the ascending colon. The                            polyp was sessile. The polyp was removed with a                            cold biopsy forceps. Resection and retrieval were                            complete. Verification of patient identification                            for the specimen was done. Estimated blood loss was                            minimal.                           Multiple diverticula were found in the sigmoid                            colon.                           The exam was otherwise without abnormality on                             direct and retroflexion views. Complications:            No immediate complications. Estimated Blood Loss:     Estimated blood loss was minimal. Impression:               - Four 3 to 6 mm polyps in the rectum and in the                            sigmoid colon, removed with a cold snare. Resected                            and retrieved.                           - One 2 mm polyp in the ascending colon, removed  with a cold biopsy forceps. Resected and retrieved.                           - Diverticulosis in the sigmoid colon.                           - The examination was otherwise normal on direct                            and retroflexion views. Recommendation:           - Patient has a contact number available for                            emergencies. The signs and symptoms of potential                            delayed complications were discussed with the                            patient. Return to normal activities tomorrow.                            Written discharge instructions were provided to the                            patient.                           - Resume previous diet.                           - Continue present medications.                           - Repeat colonoscopy is recommended. The                            colonoscopy date will be determined after pathology                            results from today's exam become available for                            review. Gatha Mayer, MD 07/01/2017 10:15:29 AM This report has been signed electronically.

## 2017-07-01 NOTE — Progress Notes (Signed)
Called to room to assist during endoscopic procedure.  Patient ID and intended procedure confirmed with present staff. Received instructions for my participation in the procedure from the performing physician.  

## 2017-07-01 NOTE — Progress Notes (Signed)
Report given to PACU, vss 

## 2017-07-01 NOTE — Patient Instructions (Addendum)
I found and removed 5 polyps today. All look benign.  You also have diverticulosis - thickened muscle rings and pouches in the colon wall. Please read the handout about this condition.  I will let you know pathology results and when to have another routine colonoscopy by mail and/or My Chart.  I appreciate the opportunity to care for you. Gatha Mayer, MD, FACG    YOU HAD AN ENDOSCOPIC PROCEDURE TODAY AT [facility]:   Refer to the procedure report that was given to you for any specific questions about what was found during the examination.  If the procedure report does not answer your questions, please call your gastroenterologist to clarify.  If you requested that your care partner not be given the details of your procedure findings, then the procedure report has been included in a sealed envelope for you to review at your convenience later.  YOU SHOULD EXPECT: Some feelings of bloating in the abdomen. Passage of more gas than usual.  Walking can help get rid of the air that was put into your GI tract during the procedure and reduce the bloating. If you had a lower endoscopy (such as a colonoscopy or flexible sigmoidoscopy) you may notice spotting of blood in your stool or on the toilet paper. If you underwent a bowel prep for your procedure, you may not have a normal bowel movement for a few days.  Please Note:  You might notice some irritation and congestion in your nose or some drainage.  This is from the oxygen used during your procedure.  There is no need for concern and it should clear up in a day or so.  SYMPTOMS TO REPORT IMMEDIATELY:   Following lower endoscopy (colonoscopy or flexible sigmoidoscopy):  Excessive amounts of blood in the stool  Significant tenderness or worsening of abdominal pains  Swelling of the abdomen that is new, acute  Fever of 100F or higher    For urgent or emergent issues, a gastroenterologist can be reached at any hour by  calling [phone].   DIET:  We do recommend a small meal at first, but then you may proceed to your regular diet.  Drink plenty of fluids but you should avoid alcoholic beverages for 24 hours.  ACTIVITY:  You should plan to take it easy for the rest of today and you should NOT DRIVE or use heavy machinery until tomorrow (because of the sedation medicines used during the test).    FOLLOW UP: Our staff will call the number listed on your records the next business day following your procedure to check on you and address any questions or concerns that you may have regarding the information given to you following your procedure. If we do not reach you, we will leave a message.  However, if you are feeling well and you are not experiencing any problems, there is no need to return our call.  We will assume that you have returned to your regular daily activities without incident.  If any biopsies were taken you will be contacted by phone or by letter within the next 1-3 weeks.  Please call us at [phone] if you have not heard about the biopsies in 3 weeks.    SIGNATURES/CONFIDENTIALITY: You and/or your care partner have signed paperwork which will be entered into your electronic medical record.  These signatures attest to the fact that that the information above on your After Visit Summary has been reviewed and is understood.  Full responsibility of the confidentiality of this discharge information lies with you and/or your care-partner.    Resume medications. Information given on polyps and diverticulosis.

## 2017-07-02 ENCOUNTER — Telehealth: Payer: Self-pay

## 2017-07-02 NOTE — Telephone Encounter (Signed)
  Follow up Call-  Call back number 07/01/2017  Post procedure Call Back phone  # [phone]  Permission to leave phone message Yes  Some recent data might be hidden     Patient questions:  Do you have a fever, pain , or abdominal swelling? No. Pain Score  0 *  Have you tolerated food without any problems? Yes.    Have you been able to return to your normal activities? Yes.    Do you have any questions about your discharge instructions: Diet   No. Medications  No. Follow up visit  No.  Do you have questions or concerns about your Care? No.  Actions: * If pain score is 4 or above: No action needed, pain <4.

## 2017-07-05 ENCOUNTER — Encounter: Payer: Self-pay | Admitting: Family Medicine

## 2017-07-07 ENCOUNTER — Encounter: Payer: Self-pay | Admitting: Internal Medicine

## 2017-07-07 DIAGNOSIS — Z86010 Personal history of colonic polyps: Secondary | ICD-10-CM

## 2017-07-07 DIAGNOSIS — Z860101 Personal history of adenomatous and serrated colon polyps: Secondary | ICD-10-CM

## 2017-07-07 HISTORY — DX: Personal history of adenomatous and serrated colon polyps: Z86.0101

## 2017-07-07 HISTORY — DX: Personal history of colonic polyps: Z86.010

## 2017-07-07 NOTE — Progress Notes (Signed)
Correction recall 2022

## 2017-07-07 NOTE — Progress Notes (Signed)
4 diminutive-small adenomas Recall 2021

## 2017-07-12 ENCOUNTER — Encounter: Payer: Self-pay | Admitting: Family Medicine

## 2017-11-09 DIAGNOSIS — S81811A Laceration without foreign body, right lower leg, initial encounter: Secondary | ICD-10-CM | POA: Diagnosis not present

## 2018-02-12 DIAGNOSIS — R1084 Generalized abdominal pain: Secondary | ICD-10-CM | POA: Diagnosis not present

## 2018-02-13 DIAGNOSIS — R1084 Generalized abdominal pain: Secondary | ICD-10-CM | POA: Diagnosis not present

## 2018-02-14 DIAGNOSIS — R1084 Generalized abdominal pain: Secondary | ICD-10-CM | POA: Diagnosis not present

## 2018-02-15 DIAGNOSIS — R1084 Generalized abdominal pain: Secondary | ICD-10-CM | POA: Diagnosis not present

## 2018-02-16 DIAGNOSIS — R1084 Generalized abdominal pain: Secondary | ICD-10-CM | POA: Diagnosis not present

## 2018-03-18 ENCOUNTER — Other Ambulatory Visit: Payer: Self-pay | Admitting: Family Medicine

## 2018-03-18 DIAGNOSIS — K760 Fatty (change of) liver, not elsewhere classified: Secondary | ICD-10-CM

## 2018-03-18 DIAGNOSIS — E785 Hyperlipidemia, unspecified: Secondary | ICD-10-CM

## 2018-03-18 DIAGNOSIS — Z125 Encounter for screening for malignant neoplasm of prostate: Secondary | ICD-10-CM

## 2018-03-18 DIAGNOSIS — E039 Hypothyroidism, unspecified: Secondary | ICD-10-CM

## 2018-03-19 ENCOUNTER — Other Ambulatory Visit (INDEPENDENT_AMBULATORY_CARE_PROVIDER_SITE_OTHER): Payer: BLUE CROSS/BLUE SHIELD

## 2018-03-19 DIAGNOSIS — E785 Hyperlipidemia, unspecified: Secondary | ICD-10-CM | POA: Diagnosis not present

## 2018-03-19 DIAGNOSIS — E039 Hypothyroidism, unspecified: Secondary | ICD-10-CM

## 2018-03-19 DIAGNOSIS — K760 Fatty (change of) liver, not elsewhere classified: Secondary | ICD-10-CM | POA: Diagnosis not present

## 2018-03-19 DIAGNOSIS — Z125 Encounter for screening for malignant neoplasm of prostate: Secondary | ICD-10-CM | POA: Diagnosis not present

## 2018-03-19 LAB — LIPID PANEL
CHOLESTEROL: 177 mg/dL (ref 0–200)
HDL: 32.7 mg/dL — ABNORMAL LOW (ref >39.00)
NonHDL: 144.17
TRIGLYCERIDES: 291 mg/dL — AB (ref 0.0–149.0)
Total CHOL/HDL Ratio: 5
VLDL: 58.2 mg/dL — ABNORMAL HIGH (ref 0.0–40.0)

## 2018-03-19 LAB — COMPREHENSIVE METABOLIC PANEL
ALBUMIN: 4.2 g/dL (ref 3.5–5.2)
ALT: 11 U/L (ref 0–53)
AST: 14 U/L (ref 0–37)
Alkaline Phosphatase: 66 U/L (ref 39–117)
BILIRUBIN TOTAL: 0.3 mg/dL (ref 0.2–1.2)
BUN: 15 mg/dL (ref 6–23)
CALCIUM: 9.3 mg/dL (ref 8.4–10.5)
CO2: 28 mEq/L (ref 19–32)
CREATININE: 1.05 mg/dL (ref 0.40–1.50)
Chloride: 105 mEq/L (ref 96–112)
GFR: 78.87 mL/min (ref >60.00)
Glucose, Bld: 112 mg/dL — ABNORMAL HIGH (ref 70–99)
Potassium: 4.8 mEq/L (ref 3.5–5.1)
Sodium: 140 mEq/L (ref 135–145)
TOTAL PROTEIN: 7 g/dL (ref 6.0–8.3)

## 2018-03-19 LAB — LDL CHOLESTEROL, DIRECT: Direct LDL: 95 mg/dL

## 2018-03-19 LAB — PSA: PSA: 0.78 ng/mL (ref 0.10–4.00)

## 2018-03-19 LAB — T4, FREE: Free T4: 0.45 ng/dL — ABNORMAL LOW (ref 0.60–1.60)

## 2018-03-19 LAB — TSH: TSH: 4.52 u[IU]/mL — ABNORMAL HIGH (ref 0.35–4.50)

## 2018-03-20 LAB — T3: T3, Total: 116 ng/dL (ref 76–181)

## 2018-03-22 NOTE — Assessment & Plan Note (Signed)
Preventative protocols reviewed and updated unless pt declined. Discussed healthy diet and lifestyle.  

## 2018-03-22 NOTE — Progress Notes (Signed)
BP 118/70 (BP Location: Left Arm, Patient Position: Sitting, Cuff Size: Normal)   Pulse 75   Temp 97.8 F (36.6 C) (Oral)   Ht 5' 8.5" (1.74 m)   Wt 182 lb (82.6 kg)   SpO2 96%   BMI 27.27 kg/m    CC: CPE Subjective:    Patient ID: Malik Perry, male    DOB: 1965-10-26, 52 y.o.   MRN: [id number]  HPI: Malik Perry is a 52 y.o. male presenting on 03/23/2018 for Annual Exam   Hypothyroidism - regular dose of armour thyroid is 90mg  daily, 120mg  MWF. Strong fmhx thyroid cancer.   Preventative: COLONOSCOPY 06/2017 - 3 TA, diverticulosis, rpt 3 yrs Carlean Purl) Prostate screening - discussed. Agrees to PSA, declines DRE.  Flu declines Tdap 2014 Seat belt use discussed Sunscreen use discussed. No changing moles on skin.  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 Interim medical history since our last visit reviewed. Allergies and medications reviewed and updated. Outpatient Medications Prior to Visit  Medication Sig Dispense Refill  . promethazine (PHENERGAN) 25 MG tablet Take 1 tablet (25 mg total) by mouth every 8 (eight) hours as needed for nausea or vomiting. 20 tablet 0  . sildenafil (VIAGRA) 100 MG tablet TAKE 0.5-1 TABLETS (50-100 MG TOTAL) BY MOUTH AS NEEDED FOR ERECTILE DYSFUNCTION. 9 tablet 9  . thyroid (ARMOUR THYROID) 30 MG tablet Take 1 tablet (30 mg total) by mouth every Monday, Wednesday, and Friday. With 90mg  dose for total 120mg  20 tablet 11  . thyroid (ARMOUR THYROID) 90 MG tablet Take 1 tablet (90 mg total) by mouth daily before  breakfast. 30 tablet 11   No facility-administered medications prior to visit.      Per HPI unless specifically indicated in ROS section below Review of Systems  Constitutional: Negative for activity change, appetite change, chills, fatigue, fever and unexpected weight change.  HENT: Negative for hearing loss.   Eyes: Negative for visual disturbance.  Respiratory: Negative for cough, chest tightness, shortness of breath and wheezing.   Cardiovascular: Negative for chest pain, palpitations and leg swelling.  Gastrointestinal: Negative for abdominal distention, abdominal pain, blood in stool, constipation, diarrhea, nausea and vomiting.  Genitourinary: Negative for difficulty urinating and hematuria.  Musculoskeletal: Negative for arthralgias, myalgias and neck pain.  Skin: Negative for rash.  Neurological: Negative for dizziness, seizures, syncope and headaches.  Hematological: Negative for adenopathy. Does not bruise/bleed easily.  Psychiatric/Behavioral: Negative for dysphoric mood. The patient is not nervous/anxious.        Objective:    BP 118/70 (BP Location: Left Arm, Patient Position: Sitting, Cuff Size: Normal)   Pulse 75   Temp 97.8 F (36.6 C) (Oral)   Ht 5' 8.5" (1.74 m)   Wt 182 lb (82.6 kg)   SpO2 96%   BMI 27.27 kg/m   Wt Readings from Last 3 Encounters:  03/23/18 182 lb (82.6 kg)  07/01/17 187 lb (84.8 kg)  06/17/17 187 lb (84.8 kg)    Physical Exam  Constitutional: He is oriented to person, place, and time. He appears  well-developed and well-nourished. No distress.  HENT:  Head: Normocephalic and atraumatic.  Right Ear: Hearing, tympanic membrane, external ear and ear canal normal.  Left Ear: Hearing, tympanic membrane, external ear and ear canal normal.  Nose: Nose normal.  Mouth/Throat: Uvula is midline, oropharynx is clear and moist and mucous membranes are normal. No oropharyngeal exudate, posterior oropharyngeal edema or posterior oropharyngeal  erythema.  Eyes: Pupils are equal, round, and reactive to light. Conjunctivae and EOM are normal. No scleral icterus.  Neck: Normal range of motion. Neck supple. No thyromegaly present.  Cardiovascular: Normal rate, regular rhythm, normal heart sounds and intact distal pulses.  No murmur heard. Pulses:      Radial pulses are 2+ on the right side, and 2+ on the left side.  Pulmonary/Chest: Effort normal and breath sounds normal. No respiratory distress. He has no wheezes. He has no rales.  Abdominal: Soft. Bowel sounds are normal. He exhibits no distension and no mass. There is no tenderness. There is no rebound and no guarding.  Musculoskeletal: Normal range of motion. He exhibits no edema.  Lymphadenopathy:    He has no cervical adenopathy.  Neurological: He is alert and oriented to person, place, and time.  CN grossly intact, station and gait intact  Skin: Skin is warm and dry. No rash noted.  Psychiatric: He has a normal mood and affect. His behavior is normal. Judgment and thought content normal.  Nursing note and vitals reviewed.  Results for orders placed or performed in visit on 03/23/18  POCT glycosylated hemoglobin (Hb A1C)  Result Value Ref Range   Hemoglobin A1C 5.9 (A) 4.0 - 5.6 %   HbA1c POC (<> result, manual entry)     HbA1c, POC (prediabetic range)     HbA1c, POC (controlled diabetic range)        Assessment & Plan:   Problem List Items Addressed This Visit    Smoker    Continue to encourage cessation. Precontemplative.       Prediabetes    Update A1c today - encouraged decreased sweetened beverages.       Hypothyroidism    Chronic, stable. Slightly underactive by labs - will increase armour thyroid dose by 30mg /wk. See instructions. Rpt labs 3 months.       Relevant Medications   thyroid (ARMOUR THYROID) 30 MG tablet   thyroid (ARMOUR THYROID) 90 MG tablet   Health maintenance examination - Primary    Preventative protocols reviewed and updated unless pt  declined. Discussed healthy diet and lifestyle.       Relevant Orders   POCT glycosylated hemoglobin (Hb A1C) (Completed)   Fatty liver    LFTs normal.       Erectile dysfunction    viagra effective - refilled.       Dyslipidemia    Chronic, triglycerides worse. Reviewed dietary changes to affect improved readings. Continue to monitor. The 10-year ASCVD risk score Mikey Bussing DC Brooke Bonito., et al., 2013) is: 9.2%   Values used to calculate the score:     Age: 37 years     Sex: Male     Is Non-Hispanic African American: No     Diabetic: No     Tobacco smoker: Yes     Systolic Blood Pressure: 626 mmHg     Is BP treated: No     HDL Cholesterol: 32.7 mg/dL     Total Cholesterol: 177 mg/dL       Cannabis use, uncomplicated    continue to encourage  cessation. Precontemplative.           Meds ordered this encounter  Medications  . promethazine (PHENERGAN) 25 MG tablet    Sig: Take 1 tablet (25 mg total) by mouth every 8 (eight) hours as needed for nausea or vomiting.    Dispense:  20 tablet    Refill:  1  . thyroid (ARMOUR THYROID) 30 MG tablet    Sig: Take 1 tablet (30 mg total) by mouth as directed. 4 days a week with 90mg  dose for total 120mg  (MWFS)    Dispense:  30 tablet    Refill:  11  . thyroid (ARMOUR THYROID) 90 MG tablet    Sig: Take 1 tablet (90 mg total) by mouth daily before breakfast.    Dispense:  30 tablet    Refill:  11  . sildenafil (VIAGRA) 100 MG tablet    Sig: Take 0.5-1 tablets (50-100 mg total) by mouth as needed for erectile dysfunction.    Dispense:  9 tablet    Refill:  9   Orders Placed This Encounter  Procedures  . POCT glycosylated hemoglobin (Hb A1C)    Follow up plan: Return in about 1 year (around 03/24/2019) for annual exam, prior fasting for blood work.  Ria Bush, MD

## 2018-03-23 ENCOUNTER — Ambulatory Visit (INDEPENDENT_AMBULATORY_CARE_PROVIDER_SITE_OTHER): Payer: BLUE CROSS/BLUE SHIELD | Admitting: Family Medicine

## 2018-03-23 ENCOUNTER — Encounter: Payer: Self-pay | Admitting: Family Medicine

## 2018-03-23 VITALS — BP 118/70 | HR 75 | Temp 97.8°F | Ht 68.5 in | Wt 182.0 lb

## 2018-03-23 DIAGNOSIS — Z0000 Encounter for general adult medical examination without abnormal findings: Secondary | ICD-10-CM | POA: Diagnosis not present

## 2018-03-23 DIAGNOSIS — R7303 Prediabetes: Secondary | ICD-10-CM

## 2018-03-23 DIAGNOSIS — E039 Hypothyroidism, unspecified: Secondary | ICD-10-CM | POA: Diagnosis not present

## 2018-03-23 DIAGNOSIS — K760 Fatty (change of) liver, not elsewhere classified: Secondary | ICD-10-CM | POA: Diagnosis not present

## 2018-03-23 DIAGNOSIS — N529 Male erectile dysfunction, unspecified: Secondary | ICD-10-CM

## 2018-03-23 DIAGNOSIS — E785 Hyperlipidemia, unspecified: Secondary | ICD-10-CM

## 2018-03-23 DIAGNOSIS — F17200 Nicotine dependence, unspecified, uncomplicated: Secondary | ICD-10-CM | POA: Diagnosis not present

## 2018-03-23 DIAGNOSIS — F1290 Cannabis use, unspecified, uncomplicated: Secondary | ICD-10-CM

## 2018-03-23 LAB — POCT GLYCOSYLATED HEMOGLOBIN (HGB A1C): HEMOGLOBIN A1C: 5.9 % — AB (ref 4.0–5.6)

## 2018-03-23 MED ORDER — THYROID 30 MG PO TABS: 30.0000 mg | ORAL_TABLET | ORAL | 11 refills | Status: DC

## 2018-03-23 MED ORDER — SILDENAFIL CITRATE 100 MG PO TABS: 50.0000 mg | ORAL_TABLET | ORAL | 9 refills | Status: DC | PRN

## 2018-03-23 MED ORDER — PROMETHAZINE HCL 25 MG PO TABS: 25.0000 mg | ORAL_TABLET | Freq: Three times a day (TID) | ORAL | 1 refills | Status: DC | PRN

## 2018-03-23 MED ORDER — THYROID 90 MG PO TABS: 90.0000 mg | ORAL_TABLET | Freq: Every day | ORAL | 11 refills | Status: DC

## 2018-03-23 NOTE — Assessment & Plan Note (Signed)
viagra effective - refilled.

## 2018-03-23 NOTE — Assessment & Plan Note (Addendum)
continue to encourage cessation. Precontemplative.

## 2018-03-23 NOTE — Assessment & Plan Note (Signed)
Update A1c today - encouraged decreased sweetened beverages.

## 2018-03-23 NOTE — Assessment & Plan Note (Signed)
LFTs normal

## 2018-03-23 NOTE — Assessment & Plan Note (Signed)
Continue to encourage cessation. Precontemplative.  ?

## 2018-03-23 NOTE — Assessment & Plan Note (Signed)
Chronic, triglycerides worse. Reviewed dietary changes to affect improved readings. Continue to monitor. The 10-year ASCVD risk score Malik Perry Malik Perry., et al., 2013) is: 9.2%   Values used to calculate the score:     Age: 52 years     Sex: Male     Is Non-Hispanic African American: No     Diabetic: No     Tobacco smoker: Yes     Systolic Blood Pressure: 539 mmHg     Is BP treated: No     HDL Cholesterol: 32.7 mg/dL     Total Cholesterol: 177 mg/dL

## 2018-03-23 NOTE — Patient Instructions (Signed)
A1c today Increase thyroid medicine by 30mg  once a week (to 4 days a week). Return in 3 months for repeat thyroid testing Return as needed or in 1 year for next physical.  Health Maintenance, Male A healthy lifestyle and preventive care is important for your health and wellness. Ask your health care provider about what schedule of regular examinations is right for you. What should I know about weight and diet? Eat a Healthy Diet  Eat plenty of vegetables, fruits, whole grains, low-fat dairy products, and lean protein.  Do not eat a lot of foods high in solid fats, added sugars, or salt.  Maintain a Healthy Weight Regular exercise can help you achieve or maintain a healthy weight. You should:  Do at least 150 minutes of exercise each week. The exercise should increase your heart rate and make you sweat (moderate-intensity exercise).  Do strength-training exercises at least twice a week.  Watch Your Levels of Cholesterol and Blood Lipids  Have your blood tested for lipids and cholesterol every 5 years starting at 52 years of age. If you are at high risk for heart disease, you should start having your blood tested when you are 52 years old. You may need to have your cholesterol levels checked more often if: ? Your lipid or cholesterol levels are high. ? You are older than 52 years of age. ? You are at high risk for heart disease.  What should I know about cancer screening? Many types of cancers can be detected early and may often be prevented. Lung Cancer  You should be screened every year for lung cancer if: ? You are a current smoker who has smoked for at least 30 years. ? You are a former smoker who has quit within the past 15 years.  Talk to your health care provider about your screening options, when you should start screening, and how often you should be screened.  Colorectal Cancer  Routine colorectal cancer screening usually begins at 52 years of age and should be  repeated every 5-10 years until you are 52 years old. You may need to be screened more often if early forms of precancerous polyps or small growths are found. Your health care provider may recommend screening at an earlier age if you have risk factors for colon cancer.  Your health care provider may recommend using home test kits to check for hidden blood in the stool.  A small camera at the end of a tube can be used to examine your colon (sigmoidoscopy or colonoscopy). This checks for the earliest forms of colorectal cancer.  Prostate and Testicular Cancer  Depending on your age and overall health, your health care provider may do certain tests to screen for prostate and testicular cancer.  Talk to your health care provider about any symptoms or concerns you have about testicular or prostate cancer.  Skin Cancer  Check your skin from head to toe regularly.  Tell your health care provider about any new moles or changes in moles, especially if: ? There is a change in a mole's size, shape, or color. ? You have a mole that is larger than a pencil eraser.  Always use sunscreen. Apply sunscreen liberally and repeat throughout the day.  Protect yourself by wearing long sleeves, pants, a wide-brimmed hat, and sunglasses when outside.  What should I know about heart disease, diabetes, and high blood pressure?  If you are 64-7 years of age, have your blood pressure checked every 3-5 years. If  you are 30 years of age or older, have your blood pressure checked every year. You should have your blood pressure measured twice-once when you are at a hospital or clinic, and once when you are not at a hospital or clinic. Record the average of the two measurements. To check your blood pressure when you are not at a hospital or clinic, you can use: ? An automated blood pressure machine at a pharmacy. ? A home blood pressure monitor.  Talk to your health care provider about your target blood  pressure.  If you are between 56-78 years old, ask your health care provider if you should take aspirin to prevent heart disease.  Have regular diabetes screenings by checking your fasting blood sugar level. ? If you are at a normal weight and have a low risk for diabetes, have this test once every three years after the age of 56. ? If you are overweight and have a high risk for diabetes, consider being tested at a younger age or more often.  A one-time screening for abdominal aortic aneurysm (AAA) by ultrasound is recommended for men aged 65-75 years who are current or former smokers. What should I know about preventing infection? Hepatitis B If you have a higher risk for hepatitis B, you should be screened for this virus. Talk with your health care provider to find out if you are at risk for hepatitis B infection. Hepatitis C Blood testing is recommended for:  Everyone born from [birth year] through 1965.  Anyone with known risk factors for hepatitis C.  Sexually Transmitted Diseases (STDs)  You should be screened each year for STDs including gonorrhea and chlamydia if: ? You are sexually active and are younger than 52 years of age. ? You are older than 52 years of age and your health care provider tells you that you are at risk for this type of infection. ? Your sexual activity has changed since you were last screened and you are at an increased risk for chlamydia or gonorrhea. Ask your health care provider if you are at risk.  Talk with your health care provider about whether you are at high risk of being infected with HIV. Your health care provider may recommend a prescription medicine to help prevent HIV infection.  What else can I do?  Schedule regular health, dental, and eye exams.  Stay current with your vaccines (immunizations).  Do not use any tobacco products, such as cigarettes, chewing tobacco, and e-cigarettes. If you need help quitting, ask your health care  provider.  Limit alcohol intake to no more than 2 drinks per day. One drink equals 12 ounces of beer, 5 ounces of wine, or 1 ounces of hard liquor.  Do not use street drugs.  Do not share needles.  Ask your health care provider for help if you need support or information about quitting drugs.  Tell your health care provider if you often feel depressed.  Tell your health care provider if you have ever been abused or do not feel safe at home. This information is not intended to replace advice given to you by your health care provider. Make sure you discuss any questions you have with your health care provider. Document Released: 11/22/2007 Document Revised: 01/23/2016 Document Reviewed: 02/27/2015 Elsevier Interactive Patient Education  [employer].

## 2018-03-23 NOTE — Assessment & Plan Note (Signed)
Chronic, stable. Slightly underactive by labs - will increase armour thyroid dose by 30mg /wk. See instructions. Rpt labs 3 months.

## 2018-03-29 ENCOUNTER — Telehealth: Payer: Self-pay | Admitting: Family Medicine

## 2018-03-29 DIAGNOSIS — E1059 Type 1 diabetes mellitus with other circulatory complications: Secondary | ICD-10-CM | POA: Diagnosis not present

## 2018-03-29 DIAGNOSIS — I509 Heart failure, unspecified: Secondary | ICD-10-CM | POA: Diagnosis not present

## 2018-03-29 DIAGNOSIS — R0600 Dyspnea, unspecified: Secondary | ICD-10-CM | POA: Diagnosis not present

## 2018-03-29 DIAGNOSIS — R0602 Shortness of breath: Secondary | ICD-10-CM | POA: Diagnosis not present

## 2018-03-29 DIAGNOSIS — J449 Chronic obstructive pulmonary disease, unspecified: Secondary | ICD-10-CM | POA: Diagnosis not present

## 2018-03-29 DIAGNOSIS — I214 Non-ST elevation (NSTEMI) myocardial infarction: Secondary | ICD-10-CM | POA: Diagnosis not present

## 2018-03-29 DIAGNOSIS — F17200 Nicotine dependence, unspecified, uncomplicated: Secondary | ICD-10-CM | POA: Diagnosis not present

## 2018-03-29 DIAGNOSIS — I10 Essential (primary) hypertension: Secondary | ICD-10-CM | POA: Diagnosis not present

## 2018-03-29 DIAGNOSIS — I2510 Atherosclerotic heart disease of native coronary artery without angina pectoris: Secondary | ICD-10-CM | POA: Diagnosis not present

## 2018-03-29 DIAGNOSIS — M6250 Muscle wasting and atrophy, not elsewhere classified, unspecified site: Secondary | ICD-10-CM | POA: Diagnosis not present

## 2018-03-29 DIAGNOSIS — R072 Precordial pain: Secondary | ICD-10-CM | POA: Diagnosis not present

## 2018-03-29 NOTE — Telephone Encounter (Signed)
Spoke with [facility] asking if Armour thyroid rxs were received on 03/23/18. Told by Casimer Bilis, they were but per pt's insurance it was too early to fill. Says they can fill them tomorrow.   Spoke with pt relaying [facility] from [facility].  Pt verbalizes understanding.

## 2018-03-29 NOTE — Telephone Encounter (Signed)
Copied from [place] [id number]. Topic: Quick Communication - Rx Refill/Question >> Mar 29, 2018  9:14 AM Judyann Munson wrote: Medication: thyroid (ARMOUR THYROID) 90 MG tablet   Has the patient contacted their pharmacy? No   Preferred Pharmacy (with phone number or street name):   [id number] [address], [place] [phone] (Phone) [phone] (Fax)    Agent: Please be advised that RX refills may take up to 3 business days. We ask that you follow-up with your pharmacy.

## 2018-03-30 DIAGNOSIS — R0600 Dyspnea, unspecified: Secondary | ICD-10-CM | POA: Diagnosis not present

## 2018-03-30 DIAGNOSIS — R072 Precordial pain: Secondary | ICD-10-CM | POA: Diagnosis not present

## 2018-03-31 DIAGNOSIS — R072 Precordial pain: Secondary | ICD-10-CM | POA: Diagnosis not present

## 2018-03-31 DIAGNOSIS — I214 Non-ST elevation (NSTEMI) myocardial infarction: Secondary | ICD-10-CM | POA: Diagnosis not present

## 2018-04-01 DIAGNOSIS — R072 Precordial pain: Secondary | ICD-10-CM | POA: Diagnosis not present

## 2018-04-01 DIAGNOSIS — I214 Non-ST elevation (NSTEMI) myocardial infarction: Secondary | ICD-10-CM | POA: Diagnosis not present

## 2018-04-04 ENCOUNTER — Other Ambulatory Visit: Payer: Self-pay | Admitting: Family Medicine

## 2018-04-25 DIAGNOSIS — E109 Type 1 diabetes mellitus without complications: Secondary | ICD-10-CM | POA: Diagnosis not present

## 2018-04-25 DIAGNOSIS — R7989 Other specified abnormal findings of blood chemistry: Secondary | ICD-10-CM | POA: Diagnosis not present

## 2018-04-25 DIAGNOSIS — R079 Chest pain, unspecified: Secondary | ICD-10-CM | POA: Diagnosis not present

## 2018-04-25 DIAGNOSIS — I160 Hypertensive urgency: Secondary | ICD-10-CM | POA: Diagnosis not present

## 2018-04-25 DIAGNOSIS — J441 Chronic obstructive pulmonary disease with (acute) exacerbation: Secondary | ICD-10-CM | POA: Diagnosis not present

## 2018-04-25 DIAGNOSIS — I509 Heart failure, unspecified: Secondary | ICD-10-CM | POA: Diagnosis not present

## 2018-04-25 DIAGNOSIS — I249 Acute ischemic heart disease, unspecified: Secondary | ICD-10-CM | POA: Diagnosis not present

## 2018-04-26 DIAGNOSIS — J441 Chronic obstructive pulmonary disease with (acute) exacerbation: Secondary | ICD-10-CM | POA: Diagnosis not present

## 2018-04-26 DIAGNOSIS — E1142 Type 2 diabetes mellitus with diabetic polyneuropathy: Secondary | ICD-10-CM | POA: Diagnosis not present

## 2018-04-26 DIAGNOSIS — E119 Type 2 diabetes mellitus without complications: Secondary | ICD-10-CM | POA: Diagnosis not present

## 2018-04-26 DIAGNOSIS — Z79899 Other long term (current) drug therapy: Secondary | ICD-10-CM | POA: Diagnosis not present

## 2018-04-26 DIAGNOSIS — F1910 Other psychoactive substance abuse, uncomplicated: Secondary | ICD-10-CM | POA: Diagnosis not present

## 2018-04-26 DIAGNOSIS — R079 Chest pain, unspecified: Secondary | ICD-10-CM | POA: Diagnosis not present

## 2018-04-26 DIAGNOSIS — T82855A Stenosis of coronary artery stent, initial encounter: Secondary | ICD-10-CM | POA: Diagnosis not present

## 2018-04-26 DIAGNOSIS — E8770 Fluid overload, unspecified: Secondary | ICD-10-CM | POA: Diagnosis not present

## 2018-04-26 DIAGNOSIS — R072 Precordial pain: Secondary | ICD-10-CM | POA: Diagnosis not present

## 2018-04-26 DIAGNOSIS — I110 Hypertensive heart disease with heart failure: Secondary | ICD-10-CM | POA: Diagnosis not present

## 2018-04-26 DIAGNOSIS — Z7901 Long term (current) use of anticoagulants: Secondary | ICD-10-CM | POA: Diagnosis not present

## 2018-04-26 DIAGNOSIS — I252 Old myocardial infarction: Secondary | ICD-10-CM | POA: Diagnosis not present

## 2018-04-26 DIAGNOSIS — E785 Hyperlipidemia, unspecified: Secondary | ICD-10-CM | POA: Diagnosis not present

## 2018-04-26 DIAGNOSIS — E1140 Type 2 diabetes mellitus with diabetic neuropathy, unspecified: Secondary | ICD-10-CM | POA: Diagnosis not present

## 2018-04-26 DIAGNOSIS — I25110 Atherosclerotic heart disease of native coronary artery with unstable angina pectoris: Secondary | ICD-10-CM | POA: Diagnosis not present

## 2018-04-26 DIAGNOSIS — Z794 Long term (current) use of insulin: Secondary | ICD-10-CM | POA: Diagnosis not present

## 2018-04-26 DIAGNOSIS — R0602 Shortness of breath: Secondary | ICD-10-CM | POA: Diagnosis not present

## 2018-04-26 DIAGNOSIS — I160 Hypertensive urgency: Secondary | ICD-10-CM | POA: Diagnosis not present

## 2018-04-26 DIAGNOSIS — I249 Acute ischemic heart disease, unspecified: Secondary | ICD-10-CM | POA: Diagnosis not present

## 2018-04-26 DIAGNOSIS — F329 Major depressive disorder, single episode, unspecified: Secondary | ICD-10-CM | POA: Diagnosis not present

## 2018-04-26 DIAGNOSIS — Q245 Malformation of coronary vessels: Secondary | ICD-10-CM | POA: Diagnosis not present

## 2018-04-26 DIAGNOSIS — F17200 Nicotine dependence, unspecified, uncomplicated: Secondary | ICD-10-CM | POA: Diagnosis not present

## 2018-04-26 DIAGNOSIS — I2510 Atherosclerotic heart disease of native coronary artery without angina pectoris: Secondary | ICD-10-CM | POA: Diagnosis not present

## 2018-04-26 DIAGNOSIS — Z955 Presence of coronary angioplasty implant and graft: Secondary | ICD-10-CM | POA: Diagnosis not present

## 2018-04-26 DIAGNOSIS — I214 Non-ST elevation (NSTEMI) myocardial infarction: Secondary | ICD-10-CM | POA: Diagnosis not present

## 2018-04-26 DIAGNOSIS — I200 Unstable angina: Secondary | ICD-10-CM | POA: Diagnosis not present

## 2018-04-26 DIAGNOSIS — I509 Heart failure, unspecified: Secondary | ICD-10-CM | POA: Diagnosis not present

## 2018-04-26 DIAGNOSIS — J449 Chronic obstructive pulmonary disease, unspecified: Secondary | ICD-10-CM | POA: Diagnosis not present

## 2018-04-26 DIAGNOSIS — R7989 Other specified abnormal findings of blood chemistry: Secondary | ICD-10-CM | POA: Diagnosis not present

## 2018-04-26 DIAGNOSIS — M069 Rheumatoid arthritis, unspecified: Secondary | ICD-10-CM | POA: Diagnosis not present

## 2018-04-26 DIAGNOSIS — E109 Type 1 diabetes mellitus without complications: Secondary | ICD-10-CM | POA: Diagnosis not present

## 2018-04-27 DIAGNOSIS — R072 Precordial pain: Secondary | ICD-10-CM | POA: Diagnosis not present

## 2018-04-27 DIAGNOSIS — I2510 Atherosclerotic heart disease of native coronary artery without angina pectoris: Secondary | ICD-10-CM | POA: Diagnosis not present

## 2018-04-28 DIAGNOSIS — I25110 Atherosclerotic heart disease of native coronary artery with unstable angina pectoris: Secondary | ICD-10-CM | POA: Diagnosis not present

## 2018-04-28 DIAGNOSIS — I2510 Atherosclerotic heart disease of native coronary artery without angina pectoris: Secondary | ICD-10-CM | POA: Diagnosis not present

## 2018-04-29 DIAGNOSIS — I2510 Atherosclerotic heart disease of native coronary artery without angina pectoris: Secondary | ICD-10-CM | POA: Diagnosis not present

## 2018-06-23 ENCOUNTER — Other Ambulatory Visit: Payer: Self-pay | Admitting: Family Medicine

## 2018-06-23 ENCOUNTER — Other Ambulatory Visit: Payer: BLUE CROSS/BLUE SHIELD

## 2018-06-23 DIAGNOSIS — E039 Hypothyroidism, unspecified: Secondary | ICD-10-CM

## 2018-06-29 DIAGNOSIS — F331 Major depressive disorder, recurrent, moderate: Secondary | ICD-10-CM | POA: Diagnosis not present

## 2018-06-29 DIAGNOSIS — Z79899 Other long term (current) drug therapy: Secondary | ICD-10-CM | POA: Diagnosis not present

## 2018-07-21 ENCOUNTER — Other Ambulatory Visit: Payer: BLUE CROSS/BLUE SHIELD

## 2018-08-24 DIAGNOSIS — E11649 Type 2 diabetes mellitus with hypoglycemia without coma: Secondary | ICD-10-CM | POA: Diagnosis not present

## 2018-08-24 DIAGNOSIS — I259 Chronic ischemic heart disease, unspecified: Secondary | ICD-10-CM | POA: Diagnosis not present

## 2019-04-18 ENCOUNTER — Other Ambulatory Visit: Payer: Self-pay | Admitting: Family Medicine

## 2019-04-18 NOTE — Telephone Encounter (Signed)
lvm for pt to call back and schedule cpe/labs.

## 2019-04-18 NOTE — Telephone Encounter (Signed)
E-scribed refill.  Pls schedule cpe and lab visits.

## 2019-04-19 ENCOUNTER — Other Ambulatory Visit: Payer: Self-pay | Admitting: Family Medicine

## 2019-04-21 NOTE — Telephone Encounter (Signed)
Spoke with pt's wife she said she will him the message to call us back. If pt calls back Dr. Darnell Level does have an opening on 11/23 if you want to offer that date @ 10:30am.

## 2019-04-27 NOTE — Telephone Encounter (Signed)
Pt called back and scheduled cpe for 06/14/19 with labs same day

## 2019-05-04 ENCOUNTER — Other Ambulatory Visit: Payer: Self-pay | Admitting: Family Medicine

## 2019-05-04 NOTE — Telephone Encounter (Signed)
Patient called about refill request. Patient's out of his medication.

## 2019-05-04 NOTE — Telephone Encounter (Signed)
E-scribed refill 

## 2019-06-06 ENCOUNTER — Other Ambulatory Visit: Payer: Self-pay | Admitting: Family Medicine

## 2019-06-07 NOTE — Telephone Encounter (Signed)
Last filled on 03/23/2018 #9 with 9 refills.  LOV 03/23/2018 for CPE. Next appointment on 06/14/2019 CPE

## 2019-06-14 ENCOUNTER — Other Ambulatory Visit: Payer: Self-pay

## 2019-06-14 ENCOUNTER — Encounter: Payer: Self-pay | Admitting: Family Medicine

## 2019-06-14 ENCOUNTER — Ambulatory Visit (INDEPENDENT_AMBULATORY_CARE_PROVIDER_SITE_OTHER): Payer: BC Managed Care – PPO | Admitting: Family Medicine

## 2019-06-14 ENCOUNTER — Ambulatory Visit (INDEPENDENT_AMBULATORY_CARE_PROVIDER_SITE_OTHER)
Admission: RE | Admit: 2019-06-14 | Discharge: 2019-06-14 | Disposition: A | Discharge status: Home / Self Care | Payer: BC Managed Care – PPO | Source: Ambulatory Visit | Attending: Family Medicine | Admitting: Family Medicine

## 2019-06-14 VITALS — BP 112/62 | HR 70 | Temp 97.1°F | Ht 69.0 in | Wt 180.5 lb

## 2019-06-14 DIAGNOSIS — Z0001 Encounter for general adult medical examination with abnormal findings: Secondary | ICD-10-CM | POA: Diagnosis not present

## 2019-06-14 DIAGNOSIS — E785 Hyperlipidemia, unspecified: Secondary | ICD-10-CM

## 2019-06-14 DIAGNOSIS — E039 Hypothyroidism, unspecified: Secondary | ICD-10-CM | POA: Diagnosis not present

## 2019-06-14 DIAGNOSIS — F1290 Cannabis use, unspecified, uncomplicated: Secondary | ICD-10-CM

## 2019-06-14 DIAGNOSIS — Z125 Encounter for screening for malignant neoplasm of prostate: Secondary | ICD-10-CM | POA: Diagnosis not present

## 2019-06-14 DIAGNOSIS — N529 Male erectile dysfunction, unspecified: Secondary | ICD-10-CM

## 2019-06-14 DIAGNOSIS — R0689 Other abnormalities of breathing: Secondary | ICD-10-CM

## 2019-06-14 DIAGNOSIS — K760 Fatty (change of) liver, not elsewhere classified: Secondary | ICD-10-CM

## 2019-06-14 DIAGNOSIS — R7303 Prediabetes: Secondary | ICD-10-CM | POA: Diagnosis not present

## 2019-06-14 DIAGNOSIS — F17200 Nicotine dependence, unspecified, uncomplicated: Secondary | ICD-10-CM | POA: Diagnosis not present

## 2019-06-14 DIAGNOSIS — R0989 Other specified symptoms and signs involving the circulatory and respiratory systems: Secondary | ICD-10-CM

## 2019-06-14 DIAGNOSIS — J9811 Atelectasis: Secondary | ICD-10-CM | POA: Diagnosis not present

## 2019-06-14 DIAGNOSIS — R1115 Cyclical vomiting syndrome unrelated to migraine: Secondary | ICD-10-CM

## 2019-06-14 MED ORDER — THYROID 30 MG PO TABS: ORAL_TABLET | ORAL | 3 refills | Status: DC

## 2019-06-14 MED ORDER — THYROID 90 MG PO TABS: ORAL_TABLET | ORAL | 3 refills | Status: DC

## 2019-06-14 NOTE — Assessment & Plan Note (Signed)
Update LFT's 

## 2019-06-14 NOTE — Assessment & Plan Note (Signed)
Chronic, stable on current armour thyroid dose. Did not tolerate levothyroxine. Will continue. Update TFTs today.

## 2019-06-14 NOTE — Assessment & Plan Note (Signed)
Quiescent period.

## 2019-06-14 NOTE — Addendum Note (Signed)
Addended by: Ellamae Sia on: 06/14/2019 03:06 PM   Modules accepted: Orders

## 2019-06-14 NOTE — Patient Instructions (Addendum)
Quitting smoking is the best thing you can do for your health.  Labs today Xray today Return as needed or in 1 year for next physical.   Health Maintenance, Male Adopting a healthy lifestyle and getting preventive care are important in promoting health and wellness. Ask your health care provider about:  The right schedule for you to have regular tests and exams.  Things you can do on your own to prevent diseases and keep yourself healthy. What should I know about diet, weight, and exercise? Eat a healthy diet   Eat a diet that includes plenty of vegetables, fruits, low-fat dairy products, and lean protein.  Do not eat a lot of foods that are high in solid fats, added sugars, or sodium. Maintain a healthy weight Body mass index (BMI) is a measurement that can be used to identify possible weight problems. It estimates body fat based on height and weight. Your health care provider can help determine your BMI and help you achieve or maintain a healthy weight. Get regular exercise Get regular exercise. This is one of the most important things you can do for your health. Most adults should:  Exercise for at least 150 minutes each week. The exercise should increase your heart rate and make you sweat (moderate-intensity exercise).  Do strengthening exercises at least twice a week. This is in addition to the moderate-intensity exercise.  Spend less time sitting. Even light physical activity can be beneficial. Watch cholesterol and blood lipids Have your blood tested for lipids and cholesterol at 54 years of age, then have this test every 5 years. You may need to have your cholesterol levels checked more often if:  Your lipid or cholesterol levels are high.  You are older than 54 years of age.  You are at high risk for heart disease. What should I know about cancer screening? Many types of cancers can be detected early and may often be prevented. Depending on your health history and  family history, you may need to have cancer screening at various ages. This may include screening for:  Colorectal cancer.  Prostate cancer.  Skin cancer.  Lung cancer. What should I know about heart disease, diabetes, and high blood pressure? Blood pressure and heart disease  High blood pressure causes heart disease and increases the risk of stroke. This is more likely to develop in people who have high blood pressure readings, are of African descent, or are overweight.  Talk with your health care provider about your target blood pressure readings.  Have your blood pressure checked: ? Every 3-5 years if you are 20-98 years of age. ? Every year if you are 33 years old or older.  If you are between the ages of 95 and 72 and are a current or former smoker, ask your health care provider if you should have a one-time screening for abdominal aortic aneurysm (AAA). Diabetes Have regular diabetes screenings. This checks your fasting blood sugar level. Have the screening done:  Once every three years after age 67 if you are at a normal weight and have a low risk for diabetes.  More often and at a younger age if you are overweight or have a high risk for diabetes. What should I know about preventing infection? Hepatitis B If you have a higher risk for hepatitis B, you should be screened for this virus. Talk with your health care provider to find out if you are at risk for hepatitis B infection. Hepatitis C Blood testing is  recommended for:  Everyone born from [birth year] through 1965.  Anyone with known risk factors for hepatitis C. Sexually transmitted infections (STIs)  You should be screened each year for STIs, including gonorrhea and chlamydia, if: ? You are sexually active and are younger than 54 years of age. ? You are older than 54 years of age and your health care provider tells you that you are at risk for this type of infection. ? Your sexual activity has changed since you were  last screened, and you are at increased risk for chlamydia or gonorrhea. Ask your health care provider if you are at risk.  Ask your health care provider about whether you are at high risk for HIV. Your health care provider may recommend a prescription medicine to help prevent HIV infection. If you choose to take medicine to prevent HIV, you should first get tested for HIV. You should then be tested every 3 months for as long as you are taking the medicine. Follow these instructions at home: Lifestyle  Do not use any products that contain nicotine or tobacco, such as cigarettes, e-cigarettes, and chewing tobacco. If you need help quitting, ask your health care provider.  Do not use street drugs.  Do not share needles.  Ask your health care provider for help if you need support or information about quitting drugs. Alcohol use  Do not drink alcohol if your health care provider tells you not to drink.  If you drink alcohol: ? Limit how much you have to 0-2 drinks a day. ? Be aware of how much alcohol is in your drink. In the [place] one drink equals one 12 oz bottle of beer (355 mL), one 5 oz glass of wine (148 mL), or one 1 oz glass of hard liquor (44 mL). General instructions  Schedule regular health, dental, and eye exams.  Stay current with your vaccines.  Tell your health care provider if: ? You often feel depressed. ? You have ever been abused or do not feel safe at home. Summary  Adopting a healthy lifestyle and getting preventive care are important in promoting health and wellness.  Follow your health care provider's instructions about healthy diet, exercising, and getting tested or screened for diseases.  Follow your health care provider's instructions on monitoring your cholesterol and blood pressure. This information is not intended to replace advice given to you by your health care provider. Make sure you discuss any questions you have with your health care  provider. Document Revised: 05/19/2018 Document Reviewed: 05/19/2018 Elsevier Patient Education  2020 [employer].

## 2019-06-14 NOTE — Assessment & Plan Note (Signed)
Update FLP. Not on medication. The 10-year ASCVD risk score Mikey Bussing DC Brooke Bonito., et al., 2013) is: 9.4%   Values used to calculate the score:     Age: 54 years     Sex: Male     Is Non-Hispanic African American: No     Diabetic: No     Tobacco smoker: Yes     Systolic Blood Pressure: [id number] mmHg     Is BP treated: No     HDL Cholesterol: 32.7 mg/dL     Total Cholesterol: 177 mg/dL

## 2019-06-14 NOTE — Assessment & Plan Note (Signed)
Preventative protocols reviewed and updated unless pt declined. Discussed healthy diet and lifestyle.  

## 2019-06-14 NOTE — Assessment & Plan Note (Addendum)
Continue to encourage cessation. Precontemplative.  With ronchorous RLL, check baseline CXR.

## 2019-06-14 NOTE — Progress Notes (Signed)
This visit was conducted in person.  BP 112/62 (BP Location: Left Arm, Patient Position: Sitting, Cuff Size: Normal)   Pulse 70   Temp (!) 97.1 F (36.2 C) (Temporal)   Ht 5\' 9"  (1.753 m)   Wt 180 lb 8 oz (81.9 kg)   SpO2 95%   BMI 26.66 kg/m    CC: CPE Subjective:    Patient ID: Malik Perry, male    DOB: 1966-02-26, 54 y.o.   MRN: [id number]  HPI: Malik Perry is a 54 y.o. male presenting on 06/14/2019 for Annual Exam (no concerns)   Ate 3.5 hrs ago.   Hypothyroidism - regular dose of armour thyroid is 90mg  daily, 120mg  MWFS. Strong fmhx thyroid cancer. no hyperthyroid or hypothyroid symptoms.   [facility] - no trouble in the past year.   ED - viagra 100mg  working well.   Preventative: COLONOSCOPY 06/2017 - 3 TA, diverticulosis, rpt 3 yrs Carlean Purl) Prostate screening - discussed. Agrees to PSA, declines DRE.  Flu declines Tdap 2014 Seat belt use discussed Sunscreen use discussed. No changing moles on skin.  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 Interim medical history since our last visit reviewed. Allergies and medications reviewed and updated. Outpatient Medications Prior to Visit  Medication Sig Dispense Refill  . promethazine (PHENERGAN) 25 MG tablet Take 1 tablet (25 mg total) by mouth every 8 (eight) hours as needed for nausea or vomiting. 20 tablet 1  . sildenafil (VIAGRA) 100 MG tablet TAKE 0.5-1 TABLETS (50-100 MG TOTAL) BY MOUTH AS NEEDED FOR ERECTILE DYSFUNCTION. 9 tablet 9  . ARMOUR THYROID 30 MG  tablet TAKE 3 TABLETS 4 DAYS A WEEK AS DIRECTED ON MON, WED, FRI, SAT 48 tablet 1  . ARMOUR THYROID 90 MG tablet TAKE 1 TABLET (90 MG TOTAL) BY MOUTH DAILY BEFORE BREAKFAST. 30 tablet 2   No facility-administered medications prior to visit.     Per HPI unless specifically indicated in ROS section below Review of Systems  Constitutional: Negative for activity change, appetite change, chills, fatigue, fever and unexpected weight change.  HENT: Negative for hearing loss.   Eyes: Negative for visual disturbance.  Respiratory: Negative for cough, chest tightness, shortness of breath and wheezing.   Cardiovascular: Negative for chest pain, palpitations and leg swelling.  Gastrointestinal: Negative for abdominal distention, abdominal pain, blood in stool, constipation, diarrhea, nausea and vomiting.  Endocrine: Negative for cold intolerance and heat intolerance.  Genitourinary: Negative for difficulty urinating and hematuria.  Musculoskeletal: Negative for arthralgias, myalgias and neck pain.  Skin: Negative for rash.  Neurological: Negative for dizziness, seizures, syncope and headaches.  Hematological: Negative for adenopathy. Does not bruise/bleed easily.  Psychiatric/Behavioral: Negative for dysphoric mood. The patient is not nervous/anxious.    Objective:    BP 112/62 (BP Location: Left Arm, Patient Position: Sitting, Cuff Size: Normal)   Pulse 70   Temp (!) 97.1 F (36.2 C) (Temporal)   Ht 5\' 9"  (1.753 m)   Wt 180 lb 8 oz (81.9 kg)  SpO2 95%   BMI 26.66 kg/m   Wt Readings from Last 3 Encounters:  06/14/19 180 lb 8 oz (81.9 kg)  03/23/18 182 lb (82.6 kg)  07/01/17 187 lb (84.8 kg)    Physical Exam Vitals and nursing note reviewed.  Constitutional:      General: He is not in acute distress.    Appearance: Normal appearance. He is well-developed. He is not ill-appearing.  HENT:     Head: Normocephalic and atraumatic.     Right Ear: Hearing, tympanic membrane, ear canal and  external ear normal.     Left Ear: Hearing, tympanic membrane, ear canal and external ear normal.     Mouth/Throat:     Pharynx: Uvula midline.  Eyes:     General: No scleral icterus.    Conjunctiva/sclera: Conjunctivae normal.     Pupils: Pupils are equal, round, and reactive to light.  Cardiovascular:     Rate and Rhythm: Normal rate and regular rhythm.     Pulses: Normal pulses.          Radial pulses are 2+ on the right side and 2+ on the left side.     Heart sounds: Normal heart sounds. No murmur.  Pulmonary:     Effort: Pulmonary effort is normal. No respiratory distress.     Breath sounds: Rhonchi (RLL, clear with repositioning and deep breath) present. No wheezing or rales.     Comments: Few coarse crackles RLL Abdominal:     General: Abdomen is flat. Bowel sounds are normal. There is no distension.     Palpations: Abdomen is soft. There is no mass.     Tenderness: There is no abdominal tenderness. There is no guarding or rebound.     Hernia: No hernia is present.  Musculoskeletal:        General: Normal range of motion.     Cervical back: Normal range of motion and neck supple.     Right lower leg: No edema.     Left lower leg: No edema.  Lymphadenopathy:     Cervical: No cervical adenopathy.  Skin:    General: Skin is warm and dry.     Findings: No rash.  Neurological:     General: No focal deficit present.     Mental Status: He is alert and oriented to person, place, and time.     Comments: CN grossly intact, station and gait intact  Psychiatric:        Mood and Affect: Mood normal.        Behavior: Behavior normal.        Thought Content: Thought content normal.        Judgment: Judgment normal.       Results for orders placed or performed in visit on 03/23/18  POCT glycosylated hemoglobin (Hb A1C)  Result Value Ref Range   Hemoglobin A1C 5.9 (A) 4.0 - 5.6 %   HbA1c POC (<> result, manual entry)     HbA1c, POC (prediabetic range)     HbA1c, POC  (controlled diabetic range)     Lab Results  Component Value Date   TSH 4.52 (H) 03/19/2018    Assessment & Plan:  This visit occurred during the SARS-CoV-2 public health emergency.  Safety protocols were in place, including screening questions prior to the visit, additional usage of staff PPE, and extensive cleaning of exam room while observing appropriate contact time as indicated for disinfecting solutions.   Problem List Items Addressed This Visit  Smoker    Continue to encourage cessation. Precontemplative.  With ronchorous RLL, check baseline CXR.       Relevant Orders   DG Chest 2 View   Prediabetes    Update A1c. He has decreased sodas.       Relevant Orders   Hemoglobin A1c   Hypothyroidism    Chronic, stable on current armour thyroid dose. Did not tolerate levothyroxine. Will continue. Update TFTs today.       Relevant Medications   thyroid (ARMOUR THYROID) 30 MG tablet   thyroid (ARMOUR THYROID) 90 MG tablet   Other Relevant Orders   TSH   T4, Free   T3   Fatty liver    Update LFTs      Erectile dysfunction    Doing well with 100mg  sildenafil. Declines 20mg  dose.       Encounter for routine adult medical exam with abnormal findings - Primary    Preventative protocols reviewed and updated unless pt declined. Discussed healthy diet and lifestyle.       Dyslipidemia    Update FLP. Not on medication. The 10-year ASCVD risk score Mikey Bussing DC Brooke Bonito., et al., 2013) is: 9.4%   Values used to calculate the score:     Age: 33 years     Sex: Male     Is Non-Hispanic African American: No     Diabetic: No     Tobacco smoker: Yes     Systolic Blood Pressure: [id number] mmHg     Is BP treated: No     HDL Cholesterol: 32.7 mg/dL     Total Cholesterol: 177 mg/dL       Relevant Orders   Lipid panel   Comprehensive metabolic panel   Cyclic vomiting syndrome    Quiescent period.       Cannabis use, uncomplicated    Other Visit Diagnoses    Abnormal breath sounds        Relevant Orders   DG Chest 2 View   Special screening for malignant neoplasm of prostate       Relevant Orders   PSA       Meds ordered this encounter  Medications  . thyroid (ARMOUR THYROID) 30 MG tablet    Sig: TAKE 1 TABLET 4 DAYS A WEEK AS DIRECTED ON MON, WED, FRI, SAT (take with 90mg  dose)    Dispense:  48 tablet    Refill:  3  . thyroid (ARMOUR THYROID) 90 MG tablet    Sig: TAKE 1 TABLET (90 MG TOTAL) BY MOUTH DAILY BEFORE BREAKFAST.    Dispense:  90 tablet    Refill:  3   Orders Placed This Encounter  Procedures  . DG Chest 2 View    Standing Status:   Future    Number of Occurrences:   1    Standing Expiration Date:   08/11/2020    Order Specific Question:   Reason for Exam (SYMPTOM  OR DIAGNOSIS REQUIRED)    Answer:   RLL rhonchi    Order Specific Question:   Preferred imaging location?    Answer:   [facility]    Order Specific Question:   Radiology Contrast Protocol - do NOT remove file path    Answer:   [email or web address]  . Lipid panel    Standing Status:   Future    Standing Expiration Date:   06/13/2020  . Comprehensive metabolic panel    Standing Status:   Future    Standing Expiration  Date:   06/13/2020  . TSH    Standing Status:   Future    Standing Expiration Date:   06/13/2020  . Hemoglobin A1c    Standing Status:   Future    Standing Expiration Date:   06/13/2020  . PSA    Standing Status:   Future    Standing Expiration Date:   06/13/2020  . T4, Free    Standing Status:   Future    Standing Expiration Date:   06/13/2020  . T3    Standing Status:   Future    Standing Expiration Date:   06/13/2020    Follow up plan: Return in about 1 year (around 06/13/2020) for annual exam, prior fasting for blood work.  Ria Bush, MD

## 2019-06-14 NOTE — Assessment & Plan Note (Addendum)
Update A1c. He has decreased sodas.

## 2019-06-14 NOTE — Assessment & Plan Note (Signed)
Doing well with 100mg  sildenafil. Declines 20mg  dose.

## 2019-06-15 LAB — LIPID PANEL
Cholesterol: 185 mg/dL (ref 0–200)
HDL: 45.4 mg/dL (ref >39.00)
LDL Cholesterol: 113 mg/dL — ABNORMAL HIGH (ref 0–99)
NonHDL: 139.37
Total CHOL/HDL Ratio: 4
Triglycerides: 131 mg/dL (ref 0.0–149.0)
VLDL: 26.2 mg/dL (ref 0.0–40.0)

## 2019-06-15 LAB — COMPREHENSIVE METABOLIC PANEL
ALT: 13 U/L (ref 0–53)
AST: 17 U/L (ref 0–37)
Albumin: 4.6 g/dL (ref 3.5–5.2)
Alkaline Phosphatase: 76 U/L (ref 39–117)
BUN: 17 mg/dL (ref 6–23)
CO2: 29 mEq/L (ref 19–32)
Calcium: 9.7 mg/dL (ref 8.4–10.5)
Chloride: 102 mEq/L (ref 96–112)
Creatinine, Ser: 0.93 mg/dL (ref 0.40–1.50)
GFR: 84.95 mL/min (ref >60.00)
Glucose, Bld: 94 mg/dL (ref 70–99)
Potassium: 5 mEq/L (ref 3.5–5.1)
Sodium: 137 mEq/L (ref 135–145)
Total Bilirubin: 0.5 mg/dL (ref 0.2–1.2)
Total Protein: 7.3 g/dL (ref 6.0–8.3)

## 2019-06-15 LAB — TSH: TSH: 1.37 u[IU]/mL (ref 0.35–4.50)

## 2019-06-15 LAB — PSA: PSA: 0.75 ng/mL (ref 0.10–4.00)

## 2019-06-15 LAB — T4, FREE: Free T4: 0.61 ng/dL (ref 0.60–1.60)

## 2019-06-15 LAB — HEMOGLOBIN A1C: Hgb A1c MFr Bld: 6.1 % (ref 4.6–6.5)

## 2019-06-15 LAB — T3: T3, Total: 147 ng/dL (ref 76–181)

## 2019-09-29 ENCOUNTER — Ambulatory Visit: Payer: BC Managed Care – PPO | Attending: Internal Medicine

## 2019-09-29 DIAGNOSIS — Z23 Encounter for immunization: Secondary | ICD-10-CM

## 2019-09-29 NOTE — Progress Notes (Signed)
   Covid-19 Vaccination Clinic  Name:  Malik Perry    MRN: [id number] DOB: September 16, 1965  09/29/2019  [name] was observed post Covid-19 immunization for 15 minutes without incident. He was provided with Vaccine Information Sheet and instruction to access the V-Safe system.   [name] was instructed to call 911 with any severe reactions post vaccine: Marland Kitchen Difficulty breathing  . Swelling of face and throat  . A fast heartbeat  . A bad rash all over body  . Dizziness and weakness   Immunizations Administered    Name Date Dose VIS Date Route   Pfizer COVID-19 Vaccine 09/29/2019  8:24 AM 0.3 mL 08/03/2018 Intramuscular   Manufacturer: [place]   Lot: [id number]   [place]: [id number]

## 2019-10-25 ENCOUNTER — Ambulatory Visit: Payer: Medicaid Other | Attending: Internal Medicine

## 2019-10-25 DIAGNOSIS — Z23 Encounter for immunization: Secondary | ICD-10-CM

## 2019-10-25 NOTE — Progress Notes (Signed)
   Covid-19 Vaccination Clinic  Name:  Malik Perry    MRN: [id number] DOB: [birth year]  10/25/2019  [name] was observed post Covid-19 immunization for 15 minutes without incident. He was provided with Vaccine Information Sheet and instruction to access the V-Safe system.   [name] was instructed to call 911 with any severe reactions post vaccine: Marland Kitchen Difficulty breathing  . Swelling of face and throat  . A fast heartbeat  . A bad rash all over body  . Dizziness and weakness   Immunizations Administered    Name Date Dose VIS Date Route   Pfizer COVID-19 Vaccine 10/25/2019  8:16 AM 0.3 mL 08/03/2018 Intramuscular   Manufacturer: [place]   Lot: [id number]   [place]: [id number]

## 2020-04-21 IMAGING — DX DG CHEST 2V
2 series · 2 of 2 positions shown · non-contrast
Comparison: None.

CLINICAL DATA: Right lower lobe bronchi.

EXAM:
CHEST - 2 VIEW

[chest pa]
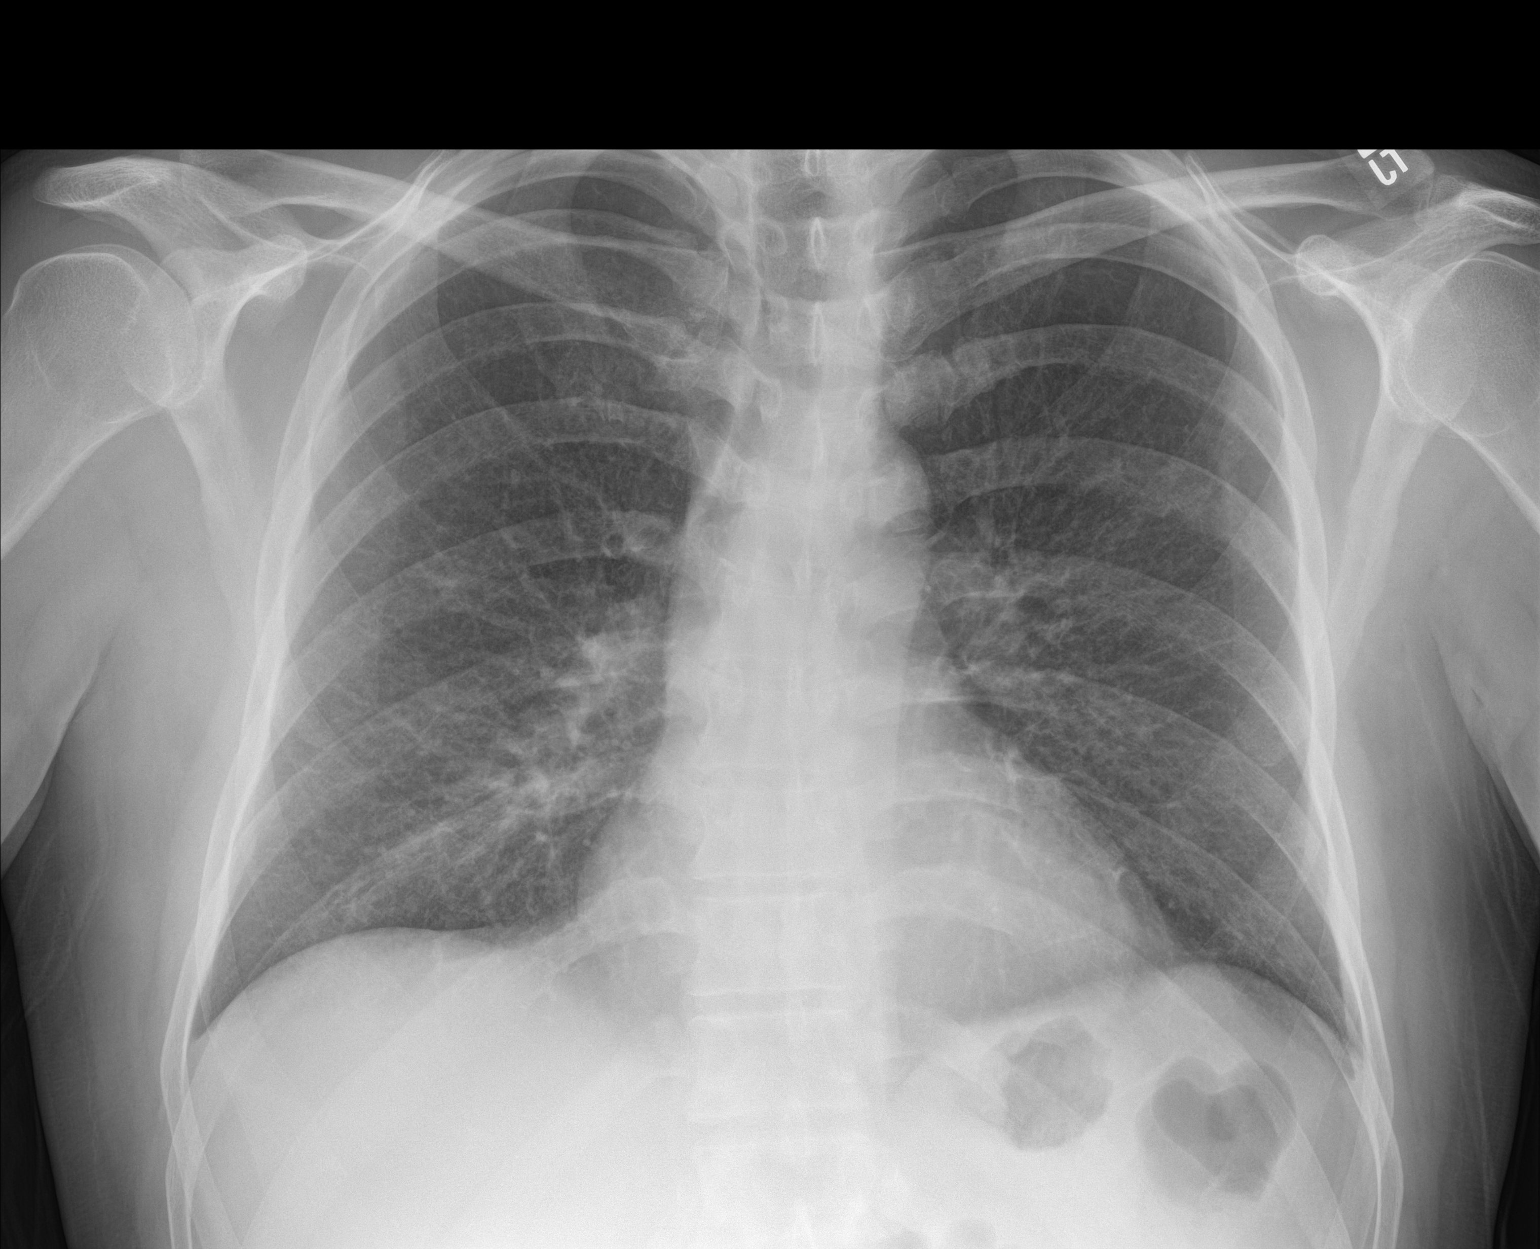

[chest lat]
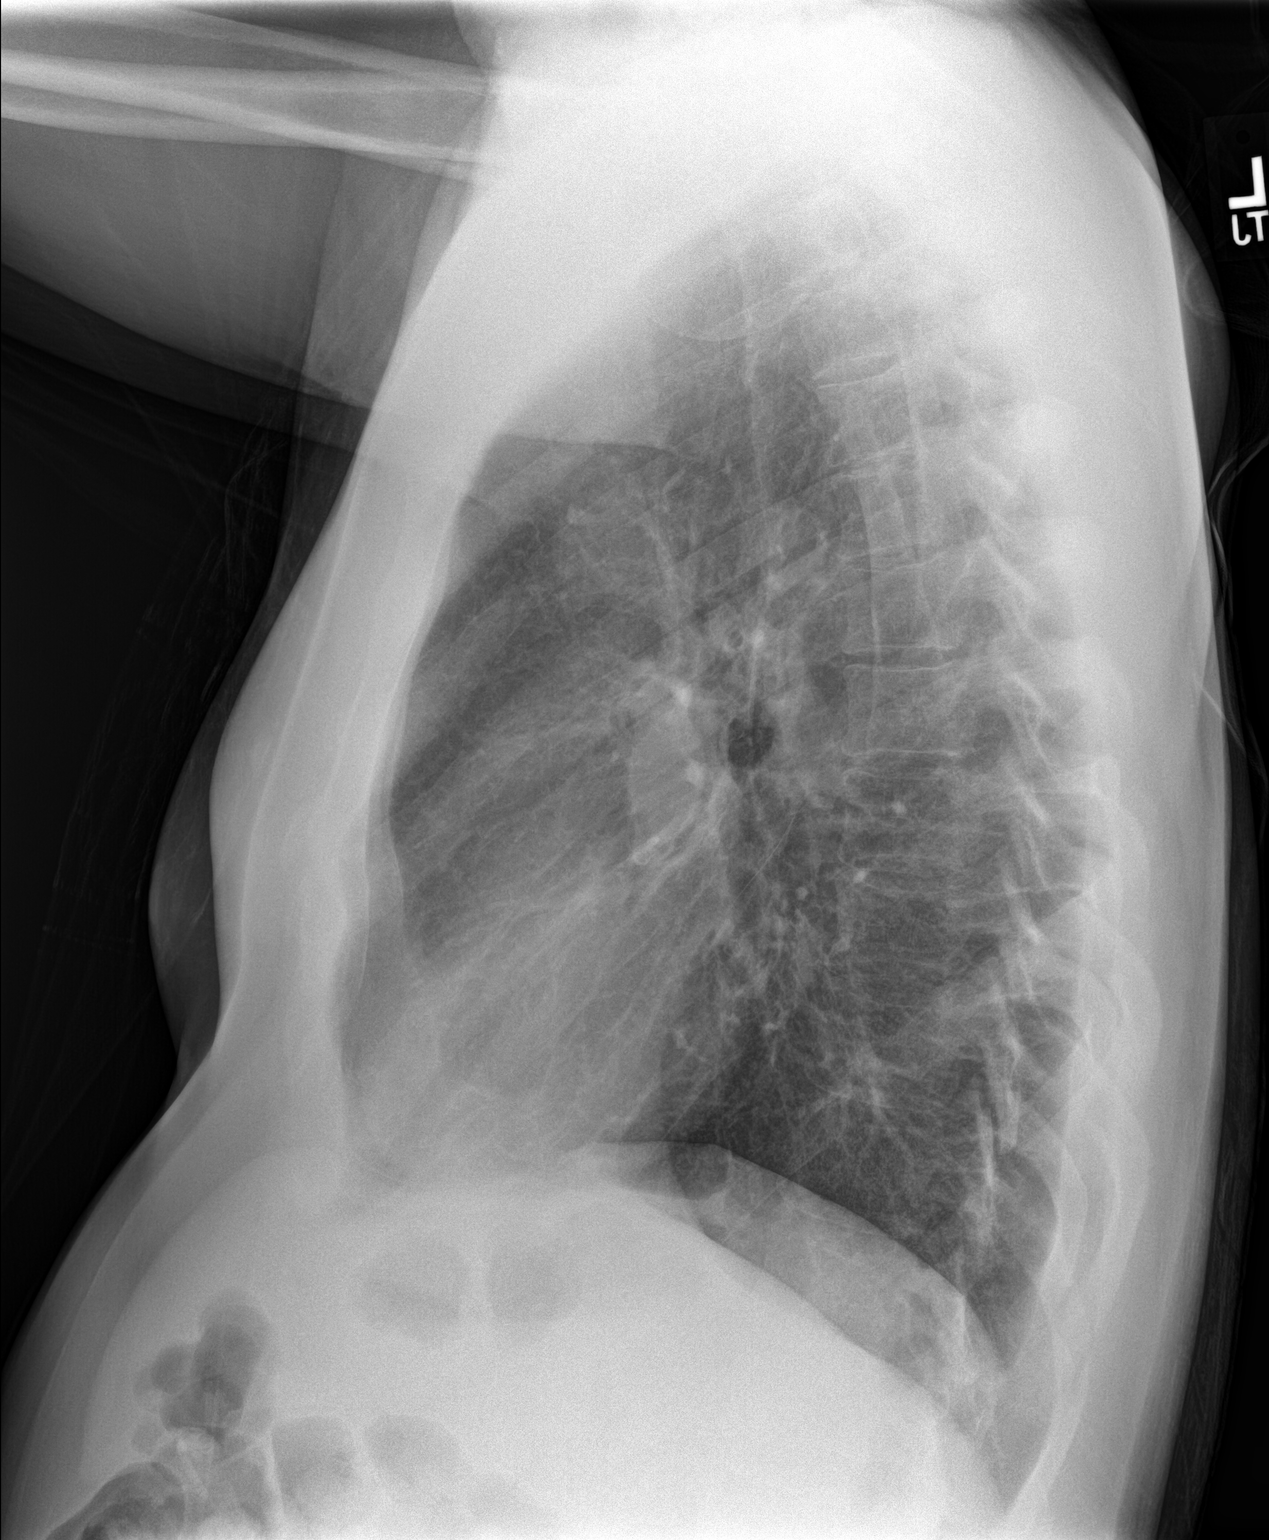

[2 of 2 positions shown; findings below may reference images not displayed]

FINDINGS: Very mild atelectasis and/or early infiltrate is seen within the
right lung base. There is no evidence of a pleural effusion or
pneumothorax. The heart size and mediastinal contours are within
normal limits. The visualized skeletal structures are unremarkable.
IMPRESSION: Very mild right basilar atelectasis and/or early infiltrate.

## 2020-06-13 ENCOUNTER — Other Ambulatory Visit: Payer: Self-pay | Admitting: Family Medicine

## 2020-07-07 ENCOUNTER — Telehealth: Payer: Self-pay | Admitting: Family Medicine

## 2020-07-09 NOTE — Telephone Encounter (Signed)
Pharmacy requests refill on: Armour Thyroid 90 mg   LAST REFILL: 06/13/2020 (Q-48, R-0) LAST OV: 06/14/2019 NEXT OV: Not Scheduled  PHARMACY: [place], [place]

## 2020-07-10 NOTE — Telephone Encounter (Signed)
Attempted to call the patient and his phone # is no longer in service. Will mail a letter to the patient. EM

## 2020-07-10 NOTE — Telephone Encounter (Signed)
Noted  

## 2020-08-05 ENCOUNTER — Other Ambulatory Visit: Payer: Self-pay | Admitting: Family Medicine

## 2020-08-06 NOTE — Telephone Encounter (Signed)
Refill request Sildenafil Last refill 06/07/19 #9/9 Last office visit 06/14/19 Upcoming appointment 10/08/20

## 2020-08-17 ENCOUNTER — Other Ambulatory Visit: Payer: Self-pay | Admitting: Family Medicine

## 2020-08-17 MED ORDER — THYROID 90 MG PO TABS: ORAL_TABLET | ORAL | 0 refills | Status: DC

## 2020-09-30 ENCOUNTER — Other Ambulatory Visit: Payer: Self-pay | Admitting: Family Medicine

## 2020-09-30 DIAGNOSIS — E785 Hyperlipidemia, unspecified: Secondary | ICD-10-CM

## 2020-09-30 DIAGNOSIS — E039 Hypothyroidism, unspecified: Secondary | ICD-10-CM

## 2020-09-30 DIAGNOSIS — Z125 Encounter for screening for malignant neoplasm of prostate: Secondary | ICD-10-CM

## 2020-09-30 DIAGNOSIS — R7303 Prediabetes: Secondary | ICD-10-CM

## 2020-10-01 ENCOUNTER — Other Ambulatory Visit (INDEPENDENT_AMBULATORY_CARE_PROVIDER_SITE_OTHER): Payer: BC Managed Care – PPO

## 2020-10-01 ENCOUNTER — Other Ambulatory Visit: Payer: Self-pay

## 2020-10-01 DIAGNOSIS — E039 Hypothyroidism, unspecified: Secondary | ICD-10-CM

## 2020-10-01 DIAGNOSIS — R7303 Prediabetes: Secondary | ICD-10-CM | POA: Diagnosis not present

## 2020-10-01 DIAGNOSIS — Z125 Encounter for screening for malignant neoplasm of prostate: Secondary | ICD-10-CM | POA: Diagnosis not present

## 2020-10-01 DIAGNOSIS — E785 Hyperlipidemia, unspecified: Secondary | ICD-10-CM | POA: Diagnosis not present

## 2020-10-01 LAB — LIPID PANEL
Cholesterol: 151 mg/dL (ref 0–200)
HDL: 33.6 mg/dL — ABNORMAL LOW (ref >39.00)
LDL Cholesterol: 86 mg/dL (ref 0–99)
NonHDL: 117.74
Total CHOL/HDL Ratio: 5
Triglycerides: 161 mg/dL — ABNORMAL HIGH (ref 0.0–149.0)
VLDL: 32.2 mg/dL (ref 0.0–40.0)

## 2020-10-01 LAB — COMPREHENSIVE METABOLIC PANEL
ALT: 10 U/L (ref 0–53)
AST: 14 U/L (ref 0–37)
Albumin: 4 g/dL (ref 3.5–5.2)
Alkaline Phosphatase: 68 U/L (ref 39–117)
BUN: 16 mg/dL (ref 6–23)
CO2: 25 mEq/L (ref 19–32)
Calcium: 9.2 mg/dL (ref 8.4–10.5)
Chloride: 106 mEq/L (ref 96–112)
Creatinine, Ser: 1.03 mg/dL (ref 0.40–1.50)
GFR: 82.4 mL/min (ref >60.00)
Glucose, Bld: 109 mg/dL — ABNORMAL HIGH (ref 70–99)
Potassium: 4.6 mEq/L (ref 3.5–5.1)
Sodium: 139 mEq/L (ref 135–145)
Total Bilirubin: 0.3 mg/dL (ref 0.2–1.2)
Total Protein: 6.9 g/dL (ref 6.0–8.3)

## 2020-10-01 LAB — PSA: PSA: 0.69 ng/mL (ref 0.10–4.00)

## 2020-10-01 LAB — HEMOGLOBIN A1C: Hgb A1c MFr Bld: 6.3 % (ref 4.6–6.5)

## 2020-10-01 LAB — TSH: TSH: 2.67 u[IU]/mL (ref 0.35–4.50)

## 2020-10-01 LAB — T4, FREE: Free T4: 0.54 ng/dL — ABNORMAL LOW (ref 0.60–1.60)

## 2020-10-02 LAB — T3: T3, Total: 212 ng/dL — ABNORMAL HIGH (ref 76–181)

## 2020-10-08 ENCOUNTER — Encounter: Payer: Self-pay | Admitting: Family Medicine

## 2020-10-08 ENCOUNTER — Other Ambulatory Visit: Payer: Self-pay

## 2020-10-08 ENCOUNTER — Ambulatory Visit (INDEPENDENT_AMBULATORY_CARE_PROVIDER_SITE_OTHER): Payer: BC Managed Care – PPO | Admitting: Family Medicine

## 2020-10-08 VITALS — BP 114/60 | HR 73 | Temp 97.7°F | Ht 68.75 in | Wt 181.2 lb

## 2020-10-08 DIAGNOSIS — Z0000 Encounter for general adult medical examination without abnormal findings: Secondary | ICD-10-CM | POA: Diagnosis not present

## 2020-10-08 DIAGNOSIS — E785 Hyperlipidemia, unspecified: Secondary | ICD-10-CM

## 2020-10-08 DIAGNOSIS — F17200 Nicotine dependence, unspecified, uncomplicated: Secondary | ICD-10-CM

## 2020-10-08 DIAGNOSIS — N529 Male erectile dysfunction, unspecified: Secondary | ICD-10-CM

## 2020-10-08 DIAGNOSIS — E039 Hypothyroidism, unspecified: Secondary | ICD-10-CM

## 2020-10-08 DIAGNOSIS — Z86010 Personal history of colonic polyps: Secondary | ICD-10-CM

## 2020-10-08 DIAGNOSIS — B351 Tinea unguium: Secondary | ICD-10-CM

## 2020-10-08 DIAGNOSIS — Z1211 Encounter for screening for malignant neoplasm of colon: Secondary | ICD-10-CM

## 2020-10-08 DIAGNOSIS — F1290 Cannabis use, unspecified, uncomplicated: Secondary | ICD-10-CM

## 2020-10-08 DIAGNOSIS — F1011 Alcohol abuse, in remission: Secondary | ICD-10-CM | POA: Diagnosis not present

## 2020-10-08 DIAGNOSIS — R7303 Prediabetes: Secondary | ICD-10-CM

## 2020-10-08 DIAGNOSIS — K760 Fatty (change of) liver, not elsewhere classified: Secondary | ICD-10-CM

## 2020-10-08 MED ORDER — THYROID 90 MG PO TABS: ORAL_TABLET | ORAL | 4 refills | Status: DC

## 2020-10-08 MED ORDER — EFINACONAZOLE 10 % EX SOLN: CUTANEOUS | 1 refills | Status: DC

## 2020-10-08 MED ORDER — SILDENAFIL CITRATE 100 MG PO TABS: 50.0000 mg | ORAL_TABLET | ORAL | 9 refills | Status: DC | PRN

## 2020-10-08 MED ORDER — THYROID 30 MG PO TABS: ORAL_TABLET | ORAL | 4 refills | Status: DC

## 2020-10-08 NOTE — Assessment & Plan Note (Signed)
Preventative protocols reviewed and updated unless pt declined. Discussed healthy diet and lifestyle.  

## 2020-10-08 NOTE — Assessment & Plan Note (Signed)
Refer back to GI. 

## 2020-10-08 NOTE — Patient Instructions (Addendum)
Consider shingrix vaccine 2 shot series to help prevent shingles.  We will refer you for lung cancer screening program.  Continue to consider smoking cessation - let us know if you'd like assistance.  Good to see you today Return as needed or in 1 year for next physical.   Health Maintenance, Male Adopting a healthy lifestyle and getting preventive care are important in promoting health and wellness. Ask your health care provider about:  The right schedule for you to have regular tests and exams.  Things you can do on your own to prevent diseases and keep yourself healthy. What should I know about diet, weight, and exercise? Eat a healthy diet  Eat a diet that includes plenty of vegetables, fruits, low-fat dairy products, and lean protein.  Do not eat a lot of foods that are high in solid fats, added sugars, or sodium.   Maintain a healthy weight Body mass index (BMI) is a measurement that can be used to identify possible weight problems. It estimates body fat based on height and weight. Your health care provider can help determine your BMI and help you achieve or maintain a healthy weight. Get regular exercise Get regular exercise. This is one of the most important things you can do for your health. Most adults should:  Exercise for at least 150 minutes each week. The exercise should increase your heart rate and make you sweat (moderate-intensity exercise).  Do strengthening exercises at least twice a week. This is in addition to the moderate-intensity exercise.  Spend less time sitting. Even light physical activity can be beneficial. Watch cholesterol and blood lipids Have your blood tested for lipids and cholesterol at 55 years of age, then have this test every 5 years. You may need to have your cholesterol levels checked more often if:  Your lipid or cholesterol levels are high.  You are older than 55 years of age.  You are at high risk for heart disease. What should I know  about cancer screening? Many types of cancers can be detected early and may often be prevented. Depending on your health history and family history, you may need to have cancer screening at various ages. This may include screening for:  Colorectal cancer.  Prostate cancer.  Skin cancer.  Lung cancer. What should I know about heart disease, diabetes, and high blood pressure? Blood pressure and heart disease  High blood pressure causes heart disease and increases the risk of stroke. This is more likely to develop in people who have high blood pressure readings, are of African descent, or are overweight.  Talk with your health care provider about your target blood pressure readings.  Have your blood pressure checked: ? Every 3-5 years if you are 36-36 years of age. ? Every year if you are 29 years old or older.  If you are between the ages of 86 and 3 and are a current or former smoker, ask your health care provider if you should have a one-time screening for abdominal aortic aneurysm (AAA). Diabetes Have regular diabetes screenings. This checks your fasting blood sugar level. Have the screening done:  Once every three years after age 58 if you are at a normal weight and have a low risk for diabetes.  More often and at a younger age if you are overweight or have a high risk for diabetes. What should I know about preventing infection? Hepatitis B If you have a higher risk for hepatitis B, you should be screened for this  screened for this virus. Talk with your health care provider to find out if you are at risk for hepatitis B infection. Hepatitis C Blood testing is recommended for:  Everyone born from 1945 through 1965.  Anyone with known risk factors for hepatitis C. Sexually transmitted infections (STIs)  You should be screened each year for STIs, including gonorrhea and chlamydia, if: ? You are sexually active and are younger than 55 years of age. ? You are older than 55 years of age and  your health care provider tells you that you are at risk for this type of infection. ? Your sexual activity has changed since you were last screened, and you are at increased risk for chlamydia or gonorrhea. Ask your health care provider if you are at risk.  Ask your health care provider about whether you are at high risk for HIV. Your health care provider may recommend a prescription medicine to help prevent HIV infection. If you choose to take medicine to prevent HIV, you should first get tested for HIV. You should then be tested every 3 months for as long as you are taking the medicine. Follow these instructions at home: Lifestyle  Do not use any products that contain nicotine or tobacco, such as cigarettes, e-cigarettes, and chewing tobacco. If you need help quitting, ask your health care provider.  Do not use street drugs.  Do not share needles.  Ask your health care provider for help if you need support or information about quitting drugs. Alcohol use  Do not drink alcohol if your health care provider tells you not to drink.  If you drink alcohol: ? Limit how much you have to 0-2 drinks a day. ? Be aware of how much alcohol is in your drink. In the [place] one drink equals one 12 oz bottle of beer (355 mL), one 5 oz glass of wine (148 mL), or one 1 oz glass of hard liquor (44 mL). General instructions  Schedule regular health, dental, and eye exams.  Stay current with your vaccines.  Tell your health care provider if: ? You often feel depressed. ? You have ever been abused or do not feel safe at home. Summary  Adopting a healthy lifestyle and getting preventive care are important in promoting health and wellness.  Follow your health care provider's instructions about healthy diet, exercising, and getting tested or screened for diseases.  Follow your health care provider's instructions on monitoring your cholesterol and blood pressure. This information is not intended to  replace advice given to you by your health care provider. Make sure you discuss any questions you have with your health care provider. Document Revised: 05/19/2018 Document Reviewed: 05/19/2018 Elsevier Patient Education  2021 Elsevier Inc.  

## 2020-10-08 NOTE — Assessment & Plan Note (Signed)
LFTs normal

## 2020-10-08 NOTE — Assessment & Plan Note (Signed)
viagra refilled.  

## 2020-10-08 NOTE — Assessment & Plan Note (Signed)
Encouraged limiting added sugars, carbs, decreasing sweetened beverages.

## 2020-10-08 NOTE — Assessment & Plan Note (Signed)
Continue to encourage cessation. 

## 2020-10-08 NOTE — Assessment & Plan Note (Signed)
Continues fully abstaining.

## 2020-10-08 NOTE — Assessment & Plan Note (Signed)
Chronic, stable. Encouraged increased aerobic exercise. The 10-year ASCVD risk score Mikey Bussing DC Brooke Bonito., et al., 2013) is: 8.3%   Values used to calculate the score:     Age: 55 years     Sex: Male     Is Non-Hispanic African American: No     Diabetic: No     Tobacco smoker: Yes     Systolic Blood Pressure: 972 mmHg     Is BP treated: No     HDL Cholesterol: 33.6 mg/dL     Total Cholesterol: 151 mg/dL

## 2020-10-08 NOTE — Progress Notes (Signed)
Patient ID: Malik Perry, male    DOB: 05-07-1966, 55 y.o.   MRN: [id number]  This visit was conducted in person.  BP 114/60   Pulse 73   Temp 97.7 F (36.5 C) (Temporal)   Ht 5' 8.75" (1.746 m)   Wt 181 lb 3 oz (82.2 kg)   SpO2 94%   BMI 26.95 kg/m    CC: CPE  Subjective:   HPI: Malik Perry is a 55 y.o. male presenting on 10/08/2020 for Annual Exam   Hypothyroidism - regular doseof armour thyroidis 90mg  daily, 120mg  MWFS.Strong fmhx thyroid cancer. No hyperthyroid or hypothyroid symptoms. Did not tolerate levothyroxine.   Intermittent nausea on phenergan - no recent nausea in over a year.   ED - viagra 100mg  working well.   Preventative: COLONOSCOPY 06/2017-3 TA, diverticulosis, rpt 3 yrs Carlean Purl) Prostate screening - discussed. Screening with PSA. Nocturia x1 Lung cancer screening - 40+ PY hx Flu declines COVID vaccine Pfizer 09/2019, 10/2019 Tdap 2014 shingrix - discussed - to consider Seat belt use discussed Sunscreen use discussed. No changing moles on skin.  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 Interim medical history since our last visit reviewed. Allergies and medications reviewed and updated. Outpatient Medications Prior to Visit  Medication Sig Dispense Refill  . promethazine (PHENERGAN) 25 MG tablet Take 1 tablet (25 mg total) by mouth every 8 (eight) hours as needed for nausea or vomiting. 20 tablet 1  . sildenafil (VIAGRA) 100 MG tablet TAKE 0.5-1 TABLETS (50-100 MG  TOTAL) BY MOUTH AS NEEDED FOR ERECTILE DYSFUNCTION. 9 tablet 9  . thyroid (ARMOUR THYROID) 30 MG tablet TAKE 1 TABLET 4 DAYS A WEEK AS DIRECTED ON MON, WED, FRI, SAT (TAKE WITH 90MG  DOSE) 48 tablet 0  . thyroid (ARMOUR THYROID) 90 MG tablet TAKE 1 TABLET (90 MG TOTAL) BY MOUTH DAILY BEFORE BREAKFAST. 90 tablet 0   No facility-administered medications prior to visit.     Per HPI unless specifically indicated in ROS section below Review of Systems  Constitutional: Negative for activity change, appetite change, chills, fatigue, fever and unexpected weight change.  HENT: Negative for hearing loss.   Eyes: Negative for visual disturbance.  Respiratory: Positive for cough and shortness of breath. Negative for chest tightness and wheezing.   Cardiovascular: Negative for chest pain, palpitations and leg swelling.  Gastrointestinal: Negative for abdominal distention, abdominal pain, blood in stool, constipation, diarrhea, nausea and vomiting.  Genitourinary: Negative for difficulty urinating and hematuria.  Musculoskeletal: Negative for arthralgias, myalgias and neck pain.  Skin: Negative for rash.  Neurological: Positive for dizziness (with sudden standing). Negative for seizures, syncope and headaches.  Hematological: Negative for adenopathy. Does not bruise/bleed easily.  Psychiatric/Behavioral: Negative for dysphoric mood. The patient is not nervous/anxious.    Objective:  BP 114/60   Pulse 73   Temp 97.7 F (36.5 C) (Temporal)   Ht 5' 8.75" (1.746 m)   Wt 181 lb 3 oz (82.2 kg)   SpO2 94%   BMI  26.95 kg/m   Wt Readings from Last 3 Encounters:  10/08/20 181 lb 3 oz (82.2 kg)  06/14/19 180 lb 8 oz (81.9 kg)  03/23/18 182 lb (82.6 kg)      Physical Exam Vitals and nursing note reviewed.  Constitutional:      General: He is not in acute distress.    Appearance: Normal appearance. He is well-developed. He is not ill-appearing.  HENT:     Head: Normocephalic and atraumatic.      Right Ear: Hearing, tympanic membrane, ear canal and external ear normal.     Left Ear: Hearing, tympanic membrane, ear canal and external ear normal.  Eyes:     General: No scleral icterus.    Extraocular Movements: Extraocular movements intact.     Conjunctiva/sclera: Conjunctivae normal.     Pupils: Pupils are equal, round, and reactive to light.  Neck:     Thyroid: No thyroid mass or thyromegaly.  Cardiovascular:     Rate and Rhythm: Normal rate and regular rhythm.     Pulses: Normal pulses.          Radial pulses are 2+ on the right side and 2+ on the left side.     Heart sounds: Normal heart sounds. No murmur heard.   Pulmonary:     Effort: Pulmonary effort is normal. No respiratory distress.     Breath sounds: Normal breath sounds. No wheezing, rhonchi or rales.  Abdominal:     General: Bowel sounds are normal. There is no distension.     Palpations: Abdomen is soft. There is no mass.     Tenderness: There is no abdominal tenderness. There is no guarding or rebound.     Hernia: No hernia is present.  Musculoskeletal:        General: Normal range of motion.     Cervical back: Normal range of motion and neck supple.     Right lower leg: No edema.     Left lower leg: No edema.  Lymphadenopathy:     Cervical: No cervical adenopathy.  Skin:    General: Skin is warm and dry.     Findings: No rash.  Neurological:     General: No focal deficit present.     Mental Status: He is alert and oriented to person, place, and time.     Comments: CN grossly intact, station and gait intact  Psychiatric:        Mood and Affect: Mood normal.        Behavior: Behavior normal.        Thought Content: Thought content normal.        Judgment: Judgment normal.       Results for orders placed or performed in visit on 10/01/20  PSA  Result Value Ref Range   PSA 0.69 0.10 - 4.00 ng/mL  T4, free  Result Value Ref Range   Free T4 0.54 (L) 0.60 - 1.60 ng/dL  T3  Result Value Ref Range    T3, Total 212 (H) 76 - 181 ng/dL  Hemoglobin A1c  Result Value Ref Range   Hgb A1c MFr Bld 6.3 4.6 - 6.5 %  TSH  Result Value Ref Range   TSH 2.67 0.35 - 4.50 uIU/mL  Comprehensive metabolic panel  Result Value Ref Range   Sodium 139 135 - 145 mEq/L   Potassium 4.6 3.5 - 5.1 mEq/L   Chloride 106 96 - 112 mEq/L   CO2 25 19 - 32 mEq/L  Glucose, Bld 109 (H) 70 - 99 mg/dL   BUN 16 6 - 23 mg/dL   Creatinine, Ser 1.03 0.40 - 1.50 mg/dL   Total Bilirubin 0.3 0.2 - 1.2 mg/dL   Alkaline Phosphatase 68 39 - 117 U/L   AST 14 0 - 37 U/L   ALT 10 0 - 53 U/L   Total Protein 6.9 6.0 - 8.3 g/dL   Albumin 4.0 3.5 - 5.2 g/dL   GFR 82.40 >60.00 mL/min   Calcium 9.2 8.4 - 10.5 mg/dL  Lipid panel  Result Value Ref Range   Cholesterol 151 0 - 200 mg/dL   Triglycerides 161.0 (H) 0.0 - 149.0 mg/dL   HDL 33.60 (L) >39.00 mg/dL   VLDL 32.2 0.0 - 40.0 mg/dL   LDL Cholesterol 86 0 - 99 mg/dL   Total CHOL/HDL Ratio 5    NonHDL 117.74    Assessment & Plan:  This visit occurred during the SARS-CoV-2 public health emergency.  Safety protocols were in place, including screening questions prior to the visit, additional usage of staff PPE, and extensive cleaning of exam room while observing appropriate contact time as indicated for disinfecting solutions.   Problem List Items Addressed This Visit    Smoker    Encouraged full cessation.  Will refer for lung cancer screening CT.       Relevant Orders   Ambulatory Referral for Lung Cancer Scre   History of alcohol abuse    Continues fully abstaining.      Hypothyroidism    Chronic, overall stable on current armour thyroid dosing - continue.       Relevant Medications   thyroid (ARMOUR THYROID) 30 MG tablet   thyroid (ARMOUR THYROID) 90 MG tablet   Cannabis use, uncomplicated    Continue to encourage cessation.       Fatty liver    LFTs normal.       Onychomycosis    Will send in [place] for pt to price out with new insurance.  Funginail  ineffective.       Relevant Medications   Efinaconazole 10 % SOLN   Erectile dysfunction    viagra refilled.       Health maintenance examination - Primary    Preventative protocols reviewed and updated unless pt declined. Discussed healthy diet and lifestyle.       Dyslipidemia    Chronic, stable. Encouraged increased aerobic exercise. The 10-year ASCVD risk score Mikey Bussing DC Brooke Bonito., et al., 2013) is: 8.3%   Values used to calculate the score:     Age: 63 years     Sex: Male     Is Non-Hispanic African American: No     Diabetic: No     Tobacco smoker: Yes     Systolic Blood Pressure: 462 mmHg     Is BP treated: No     HDL Cholesterol: 33.6 mg/dL     Total Cholesterol: 151 mg/dL       Hx of adenomatous colonic polyps    Refer back to GI      Prediabetes    Encouraged limiting added sugars, carbs, decreasing sweetened beverages.        Other Visit Diagnoses    Special screening for malignant neoplasms, colon       Relevant Orders   Ambulatory referral to Gastroenterology       Meds ordered this encounter  Medications  . sildenafil (VIAGRA) 100 MG tablet    Sig: Take 0.5-1 tablets (50-100 mg total)  by mouth as needed for erectile dysfunction.    Dispense:  9 tablet    Refill:  9  . thyroid (ARMOUR THYROID) 30 MG tablet    Sig: TAKE 1 TABLET 4 DAYS A WEEK AS DIRECTED ON MON, WED, FRI, SAT (TAKE WITH 90MG  DOSE)    Dispense:  48 tablet    Refill:  4  . thyroid (ARMOUR THYROID) 90 MG tablet    Sig: TAKE 1 TABLET (90 MG TOTAL) BY MOUTH DAILY BEFORE BREAKFAST.    Dispense:  90 tablet    Refill:  4  . Efinaconazole 10 % SOLN    Sig: Apply to affected toenail once daily    Dispense:  8 mL    Refill:  1   Orders Placed This Encounter  Procedures  . Ambulatory referral to Gastroenterology    Referral Priority:   Routine    Referral Type:   Consultation    Referral Reason:   Specialty Services Required    Number of Visits Requested:   1  . Ambulatory Referral for  Lung Cancer Scre    Referral Priority:   Routine    Referral Type:   Consultation    Referral Reason:   Specialty Services Required    Number of Visits Requested:   1    Patient instructions: Consider shingrix vaccine 2 shot series to help prevent shingles.  We will refer you for lung cancer screening program.  Continue to consider smoking cessation - let us know if you'd like assistance.  Good to see you today Return as needed or in 1 year for next physical.   Follow up plan: Return in about 1 year (around 10/08/2021) for annual exam, prior fasting for blood work.  Ria Bush, MD

## 2020-10-08 NOTE — Assessment & Plan Note (Signed)
Chronic, overall stable on current armour thyroid dosing - continue.

## 2020-10-08 NOTE — Assessment & Plan Note (Signed)
Will send in [place] for pt to price out with new insurance.  Funginail ineffective.

## 2020-10-08 NOTE — Assessment & Plan Note (Signed)
Encouraged full cessation.  Will refer for lung cancer screening CT.

## 2020-11-01 ENCOUNTER — Telehealth: Payer: Self-pay | Admitting: *Deleted

## 2020-11-01 NOTE — Telephone Encounter (Signed)
Received referral for low dose lung cancer screening CT scan. Message left at phone number listed in EMR for patient to call me back to facilitate scheduling scan.  

## 2020-11-26 ENCOUNTER — Telehealth: Payer: Self-pay | Admitting: *Deleted

## 2020-11-26 NOTE — Telephone Encounter (Signed)
Received referral for low dose lung cancer screening CT scan. Message left with patient's wife, at phone number listed in EMR for patient to call me back to facilitate scheduling scan.

## 2021-01-09 ENCOUNTER — Encounter: Payer: Self-pay | Admitting: Internal Medicine

## 2021-10-13 ENCOUNTER — Other Ambulatory Visit: Payer: Self-pay | Admitting: Family Medicine

## 2021-10-23 ENCOUNTER — Other Ambulatory Visit: Payer: Self-pay | Admitting: Family Medicine

## 2021-11-11 ENCOUNTER — Encounter: Payer: BC Managed Care – PPO | Admitting: Family Medicine

## 2021-11-11 ENCOUNTER — Telehealth: Payer: Self-pay

## 2021-11-11 NOTE — Telephone Encounter (Signed)
Noted  

## 2021-11-11 NOTE — Telephone Encounter (Signed)
[facility] Night - Client Nonclinical Telephone Record  [facility] Night - Client Client Site [place] Provider Ria Bush - MD Contact Type Call Who Is Calling Patient / Member / Family / Caregiver Caller Name Nasif Bos Caller Phone Number [phone] Patient Name Malik Perry Patient DOB 10-Oct-1965 Call Type Message Only Information Provided Reason for Call Request to [facility] [place] Appointment Initial Comment Caller states he is calling to cancel his appointment for today at 11 A.M. Patient request to speak to RN No Additional Comment Caller states he will call back during office hours to reschedule. Disp. Time Disposition Final User 11/11/2021 7:14:40 AM General Information Provided Yes Jamal Maes Call Closed By: Jamal Maes Transaction Date/Time: 11/11/2021 7:12:28 AM (ET

## 2021-11-11 NOTE — Telephone Encounter (Signed)
Cancelled [facility] appt scheduled for 11/11/21 at 11:30 per note pt will cb to reschedule. Sending note to [facility].

## 2022-01-09 DIAGNOSIS — G894 Chronic pain syndrome: Secondary | ICD-10-CM | POA: Diagnosis not present

## 2022-01-09 DIAGNOSIS — M5416 Radiculopathy, lumbar region: Secondary | ICD-10-CM | POA: Diagnosis not present

## 2022-01-09 DIAGNOSIS — M5136 Other intervertebral disc degeneration, lumbar region: Secondary | ICD-10-CM | POA: Diagnosis not present

## 2022-01-15 ENCOUNTER — Encounter: Payer: Self-pay | Admitting: Family Medicine

## 2022-01-15 ENCOUNTER — Ambulatory Visit (INDEPENDENT_AMBULATORY_CARE_PROVIDER_SITE_OTHER): Payer: BC Managed Care – PPO | Admitting: Family Medicine

## 2022-01-15 VITALS — BP 118/80 | HR 80 | Temp 97.9°F | Ht 68.5 in | Wt 179.1 lb

## 2022-01-15 DIAGNOSIS — E785 Hyperlipidemia, unspecified: Secondary | ICD-10-CM

## 2022-01-15 DIAGNOSIS — R7303 Prediabetes: Secondary | ICD-10-CM

## 2022-01-15 DIAGNOSIS — Z87891 Personal history of nicotine dependence: Secondary | ICD-10-CM

## 2022-01-15 DIAGNOSIS — Z0000 Encounter for general adult medical examination without abnormal findings: Secondary | ICD-10-CM

## 2022-01-15 DIAGNOSIS — Z86010 Personal history of colonic polyps: Secondary | ICD-10-CM

## 2022-01-15 DIAGNOSIS — E039 Hypothyroidism, unspecified: Secondary | ICD-10-CM

## 2022-01-15 DIAGNOSIS — Z125 Encounter for screening for malignant neoplasm of prostate: Secondary | ICD-10-CM | POA: Diagnosis not present

## 2022-01-15 DIAGNOSIS — F1290 Cannabis use, unspecified, uncomplicated: Secondary | ICD-10-CM

## 2022-01-15 DIAGNOSIS — N529 Male erectile dysfunction, unspecified: Secondary | ICD-10-CM

## 2022-01-15 MED ORDER — EFINACONAZOLE 10 % EX SOLN: CUTANEOUS | 1 refills | Status: DC

## 2022-01-15 MED ORDER — THYROID 30 MG PO TABS: ORAL_TABLET | ORAL | 3 refills | Status: DC

## 2022-01-15 MED ORDER — THYROID 90 MG PO TABS
ORAL_TABLET | ORAL | 3 refills | Status: DC
Start: 2022-01-15 — End: 2022-01-21

## 2022-01-15 MED ORDER — SILDENAFIL CITRATE 100 MG PO TABS: 50.0000 mg | ORAL_TABLET | ORAL | 9 refills | Status: DC | PRN

## 2022-01-15 NOTE — Assessment & Plan Note (Signed)
Preventative protocols reviewed and updated unless pt declined. Discussed healthy diet and lifestyle.  

## 2022-01-15 NOTE — Assessment & Plan Note (Addendum)
Encouraged cessation. Precontemplative.

## 2022-01-15 NOTE — Progress Notes (Signed)
Patient ID: Malik Perry, male    DOB: 13-May-1966, 56 y.o.   MRN: [id number]  This visit was conducted in person.  BP 118/80   Pulse 80   Temp 97.9 F (36.6 C) (Temporal)   Ht 5' 8.5" (1.74 m)   Wt 179 lb 2 oz (81.3 kg)   SpO2 95%   BMI 26.84 kg/m    CC: CPE Subjective:   HPI: Malik Perry is a 56 y.o. male presenting on 01/15/2022 for Annual Exam   Hypothyroidism - regular dose of armour thyroid is '90mg'$  daily, '120mg'$  MWFS. Strong fmhx thyroid cancer. No hyperthyroid or hypothyroid symptoms. Did not tolerate levothyroxine.   ED - viagra '100mg'$  working well.   Preventative: COLONOSCOPY 06/2017 - 3 TA, diverticulosis, rpt 3 yrs Carlean Purl), referred back last year, but he declined scheduling.  Prostate screening - discussed. Screening with PSA. Nocturia x1.  Lung cancer screening - 40+ PY hx - referred last year, declines today.  Flu declines COVID vaccine Pfizer 09/2019, 10/2019, no booster Tdap 2014 shingrix - discussed - to consider Seat belt use discussed.  Sunscreen use discussed. No changing moles on skin.  Smoking - 1 ppd, started age 47. Fully quit smoking 05/2021.  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 Interim medical history since our last visit reviewed. Allergies and medications reviewed and updated. Outpatient Medications Prior to Visit  Medication Sig Dispense Refill   promethazine (PHENERGAN) 25 MG tablet Take 1 tablet (25 mg total) by mouth every 8 (eight) hours as needed for nausea or  vomiting. 20 tablet 1   sildenafil (VIAGRA) 100 MG tablet Take 0.5-1 tablets (50-100 mg total) by mouth as needed for erectile dysfunction. 9 tablet 9   thyroid (ARMOUR THYROID) 30 MG tablet TAKE 1 TABLET 4 DAYS A WEEK AS DIRECTED ON MON, WED, FRI, SAT (TAKE WITH '90MG'$  DOSE) 48 tablet 0   thyroid (ARMOUR THYROID) 90 MG tablet TAKE 1 TABLET (90 MG TOTAL) BY MOUTH DAILY BEFORE BREAKFAST. 90 tablet 0   Efinaconazole 10 % SOLN Apply to affected toenail once daily (Patient not taking: Reported on 01/15/2022) 8 mL 1   No facility-administered medications prior to visit.     Per HPI unless specifically indicated in ROS section below Review of Systems  Constitutional:  Negative for activity change, appetite change, chills, fatigue, fever and unexpected weight change.  HENT:  Negative for hearing loss.   Eyes:  Negative for visual disturbance.  Respiratory:  Negative for cough, chest tightness, shortness of breath and wheezing.   Cardiovascular:  Negative for chest pain, palpitations and leg swelling.  Gastrointestinal:  Negative for abdominal distention, abdominal pain, blood in stool, constipation, diarrhea, nausea and vomiting.  Genitourinary:  Negative for difficulty urinating and hematuria.  Musculoskeletal:  Negative for arthralgias, myalgias and neck pain.  Skin:  Negative for rash.  Neurological:  Negative for dizziness, seizures, syncope and headaches.  Hematological:  Negative for adenopathy. Does not bruise/bleed easily.  Psychiatric/Behavioral:  Negative for dysphoric mood. The patient is not nervous/anxious.     Objective:  BP 118/80   Pulse 80   Temp 97.9 F (36.6 C) (Temporal)   Ht 5' 8.5" (1.74 m)   Wt 179 lb 2 oz (81.3 kg)   SpO2 95%   BMI 26.84 kg/m   Wt Readings from Last 3 Encounters:  01/15/22 179 lb 2 oz (81.3 kg)  10/08/20 181 lb 3 oz (82.2 kg)  06/14/19 180 lb 8 oz (81.9 kg)      Physical Exam Vitals and nursing note reviewed.  Constitutional:      General: He  is not in acute distress.    Appearance: Normal appearance. He is well-developed. He is not ill-appearing.  HENT:     Head: Normocephalic and atraumatic.     Right Ear: Hearing, tympanic membrane, ear canal and external ear normal.     Left Ear: Hearing, tympanic membrane, ear canal and external ear normal.  Eyes:     General: No scleral icterus.    Extraocular Movements: Extraocular movements intact.     Conjunctiva/sclera: Conjunctivae normal.     Pupils: Pupils are equal, round, and reactive to light.  Neck:     Thyroid: No thyroid mass or thyromegaly.  Cardiovascular:     Rate and Rhythm: Normal rate and regular rhythm.     Pulses: Normal pulses.          Radial pulses are 2+ on the right side and 2+ on the left side.     Heart sounds: Normal heart sounds. No murmur heard. Pulmonary:     Effort: Pulmonary effort is normal. No respiratory distress.     Breath sounds: Normal breath sounds. No wheezing, rhonchi or rales.  Abdominal:     General: Bowel sounds are normal. There is no distension.     Palpations: Abdomen is soft. There is no mass.     Tenderness: There is no abdominal tenderness. There is no guarding or rebound.     Hernia: No hernia is present.  Musculoskeletal:        General: Normal range of motion.     Cervical back: Normal range of motion and neck supple.     Right lower leg: No edema.     Left lower leg: No edema.  Lymphadenopathy:     Cervical: No cervical adenopathy.  Skin:    General: Skin is warm and dry.     Findings: No rash.  Neurological:     General: No focal deficit present.     Mental Status: He is alert and oriented to person, place, and time.  Psychiatric:        Mood and Affect: Mood normal.        Behavior: Behavior normal.        Thought Content: Thought content normal.        Judgment: Judgment normal.       Results for orders placed or performed in visit on 10/01/20  PSA  Result Value Ref Range   PSA 0.69 0.10 - 4.00 ng/mL  T4,  free  Result Value Ref Range   Free T4 0.54 (L) 0.60 - 1.60 ng/dL  T3  Result Value Ref Range   T3, Total 212 (H) 76 - 181 ng/dL  Hemoglobin A1c  Result Value Ref Range   Hgb A1c MFr Bld 6.3 4.6 - 6.5 %  TSH  Result Value Ref Range   TSH 2.67 0.35 - 4.50 uIU/mL  Comprehensive  metabolic panel  Result Value Ref Range   Sodium 139 135 - 145 mEq/L   Potassium 4.6 3.5 - 5.1 mEq/L   Chloride 106 96 - 112 mEq/L   CO2 25 19 - 32 mEq/L   Glucose, Bld 109 (H) 70 - 99 mg/dL   BUN 16 6 - 23 mg/dL   Creatinine, Ser 1.03 0.40 - 1.50 mg/dL   Total Bilirubin 0.3 0.2 - 1.2 mg/dL   Alkaline Phosphatase 68 39 - 117 U/L   AST 14 0 - 37 U/L   ALT 10 0 - 53 U/L   Total Protein 6.9 6.0 - 8.3 g/dL   Albumin 4.0 3.5 - 5.2 g/dL   GFR 82.40 >60.00 mL/min   Calcium 9.2 8.4 - 10.5 mg/dL  Lipid panel  Result Value Ref Range   Cholesterol 151 0 - 200 mg/dL   Triglycerides 161.0 (H) 0.0 - 149.0 mg/dL   HDL 33.60 (L) >39.00 mg/dL   VLDL 32.2 0.0 - 40.0 mg/dL   LDL Cholesterol 86 0 - 99 mg/dL   Total CHOL/HDL Ratio 5    NonHDL 117.74     Assessment & Plan:   Problem List Items Addressed This Visit     Health maintenance examination - Primary (Chronic)    Preventative protocols reviewed and updated unless pt declined. Discussed healthy diet and lifestyle.       Ex-smoker    Fully quit 05/2021 - congratulated! Declines lung cancer screening at this time.       Hypothyroidism    Chronic, currently stable on armour thyroid dose - update TFTs.       Relevant Medications   thyroid (ARMOUR THYROID) 30 MG tablet   thyroid (ARMOUR THYROID) 90 MG tablet   Other Relevant Orders   TSH   T3   T4, free   Cannabis use, uncomplicated    Encouraged cessation. Precontemplative.      Erectile dysfunction    Continue viagra which is effective.       Dyslipidemia    Update FLP off medication. The 10-year ASCVD risk score (Arnett DK, et al., 2019) is: 5%   Values used to calculate the score:      Age: 11 years     Sex: Male     Is Non-Hispanic African American: No     Diabetic: No     Tobacco smoker: No     Systolic Blood Pressure: 387 mmHg     Is BP treated: No     HDL Cholesterol: 33.6 mg/dL     Total Cholesterol: 151 mg/dL       Relevant Orders   Comprehensive metabolic panel   Lipid panel   Hx of adenomatous colonic polyps    New referral placed. # provided to call and schedule appointment.       Relevant Orders   Ambulatory referral to Gastroenterology   Prediabetes    Update A1c - caution with regular soda intake (2-3/day)      Relevant Orders   Hemoglobin A1c   Other Visit Diagnoses     Special screening for malignant neoplasm of prostate       Relevant Orders   PSA        Meds ordered this encounter  Medications   Efinaconazole 10 % SOLN    Sig: Apply to affected toenail once daily    Dispense:  8 mL    Refill:  1   sildenafil (VIAGRA) 100 MG tablet    Sig: Take  0.5-1 tablets (50-100 mg total) by mouth as needed for erectile dysfunction.    Dispense:  9 tablet    Refill:  9   thyroid (ARMOUR THYROID) 30 MG tablet    Sig: TAKE 1 TABLET 4 DAYS A WEEK AS DIRECTED ON MON, WED, FRI, SAT (TAKE WITH '90MG'$  DOSE)    Dispense:  40 tablet    Refill:  3   thyroid (ARMOUR THYROID) 90 MG tablet    Sig: TAKE 1 TABLET (90 MG TOTAL) BY MOUTH DAILY BEFORE BREAKFAST.    Dispense:  90 tablet    Refill:  3   Orders Placed This Encounter  Procedures   Comprehensive metabolic panel   Lipid panel   TSH   Hemoglobin A1c   T3   T4, free   PSA   Ambulatory referral to Gastroenterology    Referral Priority:   Routine    Referral Type:   Consultation    Referral Reason:   Specialty Services Required    Number of Visits Requested:   1    Patient instructions: Labs today  I will refer you for repeat colonoscopy. You may call [facility] to schedule an appointment at [phone].   Consider shingrix vaccine.  Return as needed or in 1 year for next physical.    Follow up plan: Return in about 1 year (around 01/16/2023) for annual exam, prior fasting for blood work.  Ria Bush, MD

## 2022-01-15 NOTE — Assessment & Plan Note (Signed)
Continue viagra which is effective.

## 2022-01-15 NOTE — Patient Instructions (Addendum)
Labs today  I will refer you for repeat colonoscopy. You may call [facility] to schedule an appointment at [phone].   Consider shingrix vaccine.  Return as needed or in 1 year for next physical.   Health Maintenance, Male Adopting a healthy lifestyle and getting preventive care are important in promoting health and wellness. Ask your health care provider about: The right schedule for you to have regular tests and exams. Things you can do on your own to prevent diseases and keep yourself healthy. What should I know about diet, weight, and exercise? Eat a healthy diet  Eat a diet that includes plenty of vegetables, fruits, low-fat dairy products, and lean protein. Do not eat a lot of foods that are high in solid fats, added sugars, or sodium. Maintain a healthy weight Body mass index (BMI) is a measurement that can be used to identify possible weight problems. It estimates body fat based on height and weight. Your health care provider can help determine your BMI and help you achieve or maintain a healthy weight. Get regular exercise Get regular exercise. This is one of the most important things you can do for your health. Most adults should: Exercise for at least 150 minutes each week. The exercise should increase your heart rate and make you sweat (moderate-intensity exercise). Do strengthening exercises at least twice a week. This is in addition to the moderate-intensity exercise. Spend less time sitting. Even light physical activity can be beneficial. Watch cholesterol and blood lipids Have your blood tested for lipids and cholesterol at 56 years of age, then have this test every 5 years. You may need to have your cholesterol levels checked more often if: Your lipid or cholesterol levels are high. You are older than 56 years of age. You are at high risk for heart disease. What should I know about cancer screening? Many types of cancers can be detected early and may often be  prevented. Depending on your health history and family history, you may need to have cancer screening at various ages. This may include screening for: Colorectal cancer. Prostate cancer. Skin cancer. Lung cancer. What should I know about heart disease, diabetes, and high blood pressure? Blood pressure and heart disease High blood pressure causes heart disease and increases the risk of stroke. This is more likely to develop in people who have high blood pressure readings or are overweight. Talk with your health care provider about your target blood pressure readings. Have your blood pressure checked: Every 3-5 years if you are 26-97 years of age. Every year if you are 88 years old or older. If you are between the ages of 46 and 33 and are a current or former smoker, ask your health care provider if you should have a one-time screening for abdominal aortic aneurysm (AAA). Diabetes Have regular diabetes screenings. This checks your fasting blood sugar level. Have the screening done: Once every three years after age 70 if you are at a normal weight and have a low risk for diabetes. More often and at a younger age if you are overweight or have a high risk for diabetes. What should I know about preventing infection? Hepatitis B If you have a higher risk for hepatitis B, you should be screened for this virus. Talk with your health care provider to find out if you are at risk for hepatitis B infection. Hepatitis C Blood testing is recommended for: Everyone born from [birth year] through 1965. Anyone with known risk factors for hepatitis  C. Sexually transmitted infections (STIs) You should be screened each year for STIs, including gonorrhea and chlamydia, if: You are sexually active and are younger than 56 years of age. You are older than 56 years of age and your health care provider tells you that you are at risk for this type of infection. Your sexual activity has changed since you were last screened,  and you are at increased risk for chlamydia or gonorrhea. Ask your health care provider if you are at risk. Ask your health care provider about whether you are at high risk for HIV. Your health care provider may recommend a prescription medicine to help prevent HIV infection. If you choose to take medicine to prevent HIV, you should first get tested for HIV. You should then be tested every 3 months for as long as you are taking the medicine. Follow these instructions at home: Alcohol use Do not drink alcohol if your health care provider tells you not to drink. If you drink alcohol: Limit how much you have to 0-2 drinks a day. Know how much alcohol is in your drink. In the [place] one drink equals one 12 oz bottle of beer (355 mL), one 5 oz glass of wine (148 mL), or one 1 oz glass of hard liquor (44 mL). Lifestyle Do not use any products that contain nicotine or tobacco. These products include cigarettes, chewing tobacco, and vaping devices, such as e-cigarettes. If you need help quitting, ask your health care provider. Do not use street drugs. Do not share needles. Ask your health care provider for help if you need support or information about quitting drugs. General instructions Schedule regular health, dental, and eye exams. Stay current with your vaccines. Tell your health care provider if: You often feel depressed. You have ever been abused or do not feel safe at home. Summary Adopting a healthy lifestyle and getting preventive care are important in promoting health and wellness. Follow your health care provider's instructions about healthy diet, exercising, and getting tested or screened for diseases. Follow your health care provider's instructions on monitoring your cholesterol and blood pressure. This information is not intended to replace advice given to you by your health care provider. Make sure you discuss any questions you have with your health care provider. Document Revised:  10/15/2020 Document Reviewed: 10/15/2020 Elsevier Patient Education  [place].

## 2022-01-15 NOTE — Assessment & Plan Note (Signed)
New referral placed. # provided to call and schedule appointment.

## 2022-01-15 NOTE — Assessment & Plan Note (Signed)
Update FLP off medication. The 10-year ASCVD risk score (Arnett DK, et al., 2019) is: 5%   Values used to calculate the score:     Age: 56 years     Sex: Male     Is Non-Hispanic African American: No     Diabetic: No     Tobacco smoker: No     Systolic Blood Pressure: 045 mmHg     Is BP treated: No     HDL Cholesterol: 33.6 mg/dL     Total Cholesterol: 151 mg/dL

## 2022-01-15 NOTE — Assessment & Plan Note (Signed)
Chronic, currently stable on armour thyroid dose - update TFTs.

## 2022-01-15 NOTE — Assessment & Plan Note (Signed)
Update A1c - caution with regular soda intake (2-3/day)

## 2022-01-15 NOTE — Assessment & Plan Note (Signed)
Fully quit 05/2021 - congratulated! Declines lung cancer screening at this time.

## 2022-01-16 LAB — COMPREHENSIVE METABOLIC PANEL
ALT: 13 U/L (ref 0–53)
AST: 17 U/L (ref 0–37)
Albumin: 4.6 g/dL (ref 3.5–5.2)
Alkaline Phosphatase: 76 U/L (ref 39–117)
BUN: 13 mg/dL (ref 6–23)
CO2: 27 mEq/L (ref 19–32)
Calcium: 9.5 mg/dL (ref 8.4–10.5)
Chloride: 98 mEq/L (ref 96–112)
Creatinine, Ser: 1.02 mg/dL (ref 0.40–1.50)
GFR: 82.61 mL/min (ref >60.00)
Glucose, Bld: 82 mg/dL (ref 70–99)
Potassium: 4.1 mEq/L (ref 3.5–5.1)
Sodium: 140 mEq/L (ref 135–145)
Total Bilirubin: 0.7 mg/dL (ref 0.2–1.2)
Total Protein: 7.4 g/dL (ref 6.0–8.3)

## 2022-01-16 LAB — LIPID PANEL
Cholesterol: 184 mg/dL (ref 0–200)
HDL: 43.2 mg/dL (ref >39.00)
LDL Cholesterol: 119 mg/dL — ABNORMAL HIGH (ref 0–99)
NonHDL: 141.17
Total CHOL/HDL Ratio: 4
Triglycerides: 109 mg/dL (ref 0.0–149.0)
VLDL: 21.8 mg/dL (ref 0.0–40.0)

## 2022-01-16 LAB — TSH: TSH: 1.37 u[IU]/mL (ref 0.35–5.50)

## 2022-01-16 LAB — T3: T3, Total: 173 ng/dL (ref 76–181)

## 2022-01-16 LAB — PSA: PSA: 0.67 ng/mL (ref 0.10–4.00)

## 2022-01-16 LAB — T4, FREE: Free T4: 0.6 ng/dL (ref 0.60–1.60)

## 2022-01-16 LAB — HEMOGLOBIN A1C: Hgb A1c MFr Bld: 6.4 % (ref 4.6–6.5)

## 2022-01-20 ENCOUNTER — Telehealth: Payer: Self-pay

## 2022-01-20 NOTE — Telephone Encounter (Signed)
Patient called in asking for all prescriptions to be re-sent to [facility] on [address] as well as this is preferred pharmacy as insurance wont cover prescriptions at [facility].

## 2022-01-21 MED ORDER — THYROID 90 MG PO TABS: ORAL_TABLET | ORAL | 3 refills | Status: DC

## 2022-01-21 MED ORDER — THYROID 30 MG PO TABS: ORAL_TABLET | ORAL | 3 refills | Status: DC

## 2022-01-21 MED ORDER — SILDENAFIL CITRATE 100 MG PO TABS: 50.0000 mg | ORAL_TABLET | ORAL | 9 refills | Status: DC | PRN

## 2022-01-21 MED ORDER — EFINACONAZOLE 10 % EX SOLN: CUTANEOUS | 1 refills | Status: DC

## 2022-01-21 NOTE — Telephone Encounter (Signed)
Pt called in wants to know stated of refill stated he need RX be sent to [facility] on [address] . Pt is out of his medication Please advise [phone]

## 2022-01-21 NOTE — Telephone Encounter (Signed)
E-scribed all refills to [facility] [address]

## 2022-02-03 ENCOUNTER — Telehealth: Payer: Self-pay

## 2022-02-03 NOTE — Telephone Encounter (Signed)
Prior auth started for Jublia 10% solution. Laquon Schunk Key: [place] Case ID: [id number] - Rx #:: [id number] for determination.

## 2022-02-05 MED ORDER — CICLOPIROX 8 % EX SOLN: Freq: Every day | CUTANEOUS | 1 refills | Status: DC

## 2022-02-05 NOTE — Telephone Encounter (Signed)
Prior auth for Jublia 10% solution has been denied. Meshilem Wiatrek KeyRoberto Scales - [place] Case ID: [id number] - Rx #: [id number] You must have tried and had a poor response to two formulary alternative drugs.  Some formulary alternative drugs are: ciclopirox 8 % solution, terbinafine tablet, itraconazole capsule.  Denial letter sent to scanning.

## 2022-02-05 NOTE — Telephone Encounter (Signed)
Left message on voicemail for patient to call the office back. 

## 2022-02-05 NOTE — Telephone Encounter (Addendum)
Plz notify pt - Jublia was denied.  He must first try ciclopirox topical solution which I've sent to his pharmacy. Update [place] with effect.

## 2022-02-05 NOTE — Addendum Note (Signed)
Addended by: Ria Bush on: 02/05/2022 01:30 PM   Modules accepted: Orders

## 2022-02-06 ENCOUNTER — Telehealth: Payer: Self-pay

## 2022-02-06 DIAGNOSIS — G894 Chronic pain syndrome: Secondary | ICD-10-CM | POA: Diagnosis not present

## 2022-02-06 DIAGNOSIS — M5136 Other intervertebral disc degeneration, lumbar region: Secondary | ICD-10-CM | POA: Diagnosis not present

## 2022-02-06 DIAGNOSIS — M5416 Radiculopathy, lumbar region: Secondary | ICD-10-CM | POA: Diagnosis not present

## 2022-02-06 NOTE — Telephone Encounter (Signed)
Lvm asking pt to call back.  Need to relay Dr. G's message.  

## 2022-02-06 NOTE — Telephone Encounter (Signed)
Rtn pt's call.  He explained again that he had already picked up Jublia and paid $130.  Says he will use it for now since he's paid for it.  Then pt asks if he can switch to ciclopirox when time for refill?  Plz advise.

## 2022-02-06 NOTE — Telephone Encounter (Signed)
Prior auth started for Ciclopirox 8% solution. Terryn Wachter KeyDesma Paganini - [place] Case ID: [id number] - Rx #: [id number] Waiting for determination.

## 2022-02-06 NOTE — Telephone Encounter (Signed)
Pt called back returning a missed call, I read to pt Dr. Synthia Innocent message below. pt stated he already got the Jublia, he paid out over $100 for med. Pls call back at [phone].     Ria Bush, MD  Physician Family Medicine Telephone Encounter Addendum Creation Time:  02/05/2022  1:24 PM   Plz notify pt - Imelda Pillow was denied.  He must first try ciclopirox topical solution which I've sent to his pharmacy. Update [place] with effect.

## 2022-02-07 NOTE — Telephone Encounter (Signed)
Lvm asking pt to call back.  Need to relay Dr. G's message.  

## 2022-02-07 NOTE — Telephone Encounter (Signed)
Ok to try ciclopirox when next refill due.

## 2022-02-11 NOTE — Telephone Encounter (Signed)
Spoke with pt relaying Dr. G's message.  Pt verbalizes understanding and expresses his thanks.  

## 2022-02-12 NOTE — Telephone Encounter (Signed)
Prior auth for Ciclopirox 8% solution has been denied. Caydon Nemitz KeyDesma Paganini - [place] Case ID: [id number] - Rx #: [id number]  The request for CICLOPIROX 8% SOLUTION has not been approved for benefits.  You must have tried and had a poor response to an oral (by mouth) antifungal drug.  You may not have to try an oral antifungal drug if you meet one of the following:  1) have a side effect or allergy to an oral antifungal, 2) have a Food and Drug Administration (FDA) labeled contraindication (a health condition or risk factor that may cause harm if you take a drug) to all oral antifungal drugs, or 3) your prescriber has provided information that an oral antifungal is not appropriate for you.  Denial letter sent to scanning.

## 2022-02-13 NOTE — Telephone Encounter (Addendum)
Noted. Plz notify pt.  Will see how he does with 1 course of Jublia. If further needed, may need to try oral antifungal.

## 2022-02-14 NOTE — Telephone Encounter (Signed)
Patient notified as instructed by telephone and verbalized understanding. Patient stated that he is okay sticking with the Jublia as long as the cost stays the same.

## 2022-03-06 DIAGNOSIS — M5136 Other intervertebral disc degeneration, lumbar region: Secondary | ICD-10-CM | POA: Diagnosis not present

## 2022-03-06 DIAGNOSIS — G894 Chronic pain syndrome: Secondary | ICD-10-CM | POA: Diagnosis not present

## 2022-03-06 DIAGNOSIS — M5416 Radiculopathy, lumbar region: Secondary | ICD-10-CM | POA: Diagnosis not present

## 2022-03-24 DIAGNOSIS — E113313 Type 2 diabetes mellitus with moderate nonproliferative diabetic retinopathy with macular edema, bilateral: Secondary | ICD-10-CM | POA: Diagnosis not present

## 2022-04-10 DIAGNOSIS — M5136 Other intervertebral disc degeneration, lumbar region: Secondary | ICD-10-CM | POA: Diagnosis not present

## 2022-04-10 DIAGNOSIS — G894 Chronic pain syndrome: Secondary | ICD-10-CM | POA: Diagnosis not present

## 2022-04-10 DIAGNOSIS — M5416 Radiculopathy, lumbar region: Secondary | ICD-10-CM | POA: Diagnosis not present

## 2022-04-23 ENCOUNTER — Telehealth: Payer: Self-pay | Admitting: Family Medicine

## 2022-04-23 NOTE — Telephone Encounter (Signed)
Patient called in and stated that the pharmacy needs an update prescription for his  thyroid (ARMOUR THYROID) 30 MG tablet. He stated that the pharmacy received a refill for 40 tablets but should be 48. Please advise. Thank you!

## 2022-04-24 MED ORDER — THYROID 30 MG PO TABS: ORAL_TABLET | ORAL | 3 refills | Status: DC

## 2022-04-24 NOTE — Telephone Encounter (Signed)
ERx new number per refill

## 2022-04-24 NOTE — Telephone Encounter (Signed)
Spoke with pt relaying Dr. G's message.  Pt expresses his thanks.  ?

## 2022-04-24 NOTE — Addendum Note (Signed)
Addended by: Ria Bush on: 04/24/2022 12:28 PM   Modules accepted: Orders

## 2022-04-28 ENCOUNTER — Encounter: Payer: Self-pay | Admitting: Family Medicine

## 2022-05-08 DIAGNOSIS — G894 Chronic pain syndrome: Secondary | ICD-10-CM | POA: Diagnosis not present

## 2022-05-08 DIAGNOSIS — M5416 Radiculopathy, lumbar region: Secondary | ICD-10-CM | POA: Diagnosis not present

## 2022-05-08 DIAGNOSIS — M5136 Other intervertebral disc degeneration, lumbar region: Secondary | ICD-10-CM | POA: Diagnosis not present

## 2022-05-12 ENCOUNTER — Telehealth: Payer: Self-pay

## 2022-05-12 ENCOUNTER — Other Ambulatory Visit (HOSPITAL_COMMUNITY): Payer: Self-pay

## 2022-05-12 NOTE — Telephone Encounter (Signed)
Pharmacy Patient Advocate Encounter   Received notification from [facility] that prior authorization for Jublia 10% solution is required/requested.  Per previous chart notes: Jublia denied due to not taking an oral antifungal agent. Patient also paid out of pocket for previous prescription.  PA not submitted

## 2022-05-12 NOTE — Telephone Encounter (Signed)
May disregard.

## 2022-06-12 DIAGNOSIS — M5136 Other intervertebral disc degeneration, lumbar region: Secondary | ICD-10-CM | POA: Diagnosis not present

## 2022-06-12 DIAGNOSIS — M47816 Spondylosis without myelopathy or radiculopathy, lumbar region: Secondary | ICD-10-CM | POA: Diagnosis not present

## 2022-06-12 DIAGNOSIS — G894 Chronic pain syndrome: Secondary | ICD-10-CM | POA: Diagnosis not present

## 2022-07-02 ENCOUNTER — Other Ambulatory Visit: Payer: Self-pay | Admitting: Family Medicine

## 2022-07-02 NOTE — Telephone Encounter (Addendum)
PA submitted and denied. Change in therapy to ciclopirox 8% (see 02/05/22 phn note). Rx sent on 02/05/22, #6.6 mL/1 to [facility] [address]

## 2022-09-02 DIAGNOSIS — M109 Gout, unspecified: Secondary | ICD-10-CM | POA: Diagnosis not present

## 2022-09-02 DIAGNOSIS — M79671 Pain in right foot: Secondary | ICD-10-CM | POA: Diagnosis not present

## 2022-09-02 DIAGNOSIS — M79672 Pain in left foot: Secondary | ICD-10-CM | POA: Diagnosis not present

## 2022-09-02 DIAGNOSIS — M2042 Other hammer toe(s) (acquired), left foot: Secondary | ICD-10-CM | POA: Diagnosis not present

## 2022-09-02 DIAGNOSIS — E1140 Type 2 diabetes mellitus with diabetic neuropathy, unspecified: Secondary | ICD-10-CM | POA: Diagnosis not present

## 2022-09-02 DIAGNOSIS — M2041 Other hammer toe(s) (acquired), right foot: Secondary | ICD-10-CM | POA: Diagnosis not present

## 2022-09-16 ENCOUNTER — Telehealth: Payer: Self-pay

## 2022-09-16 NOTE — Transitions of Care (Post Inpatient/ED Visit) (Signed)
   09/16/2022  Name: Malik Perry MRN: [id number] DOB: 18-Aug-1965  Today's TOC FU Call Status: Today's TOC FU Call Status:: Unsuccessul Call (1st Attempt) Unsuccessful Call (1st Attempt) Date: 09/16/22  Attempted to reach the patient regarding the most recent Inpatient/ED visit.  Follow Up Plan: Additional outreach attempts will be made to reach the patient to complete the Transitions of Care (Post Inpatient/ED visit) call.   Signature Kandis Fantasia, LPN [facility] Health Advisor [place] [facility] You Are. We Are. [employer] # [phone]

## 2023-01-10 ENCOUNTER — Other Ambulatory Visit: Payer: Self-pay | Admitting: Family Medicine

## 2023-01-10 DIAGNOSIS — E039 Hypothyroidism, unspecified: Secondary | ICD-10-CM

## 2023-01-10 DIAGNOSIS — R7303 Prediabetes: Secondary | ICD-10-CM

## 2023-01-10 DIAGNOSIS — Z125 Encounter for screening for malignant neoplasm of prostate: Secondary | ICD-10-CM

## 2023-01-10 DIAGNOSIS — E785 Hyperlipidemia, unspecified: Secondary | ICD-10-CM

## 2023-01-12 ENCOUNTER — Other Ambulatory Visit: Payer: Medicaid Other

## 2023-01-19 ENCOUNTER — Encounter: Payer: Self-pay | Admitting: Family Medicine

## 2023-02-08 ENCOUNTER — Other Ambulatory Visit: Payer: Self-pay | Admitting: Family Medicine

## 2023-02-08 DIAGNOSIS — E039 Hypothyroidism, unspecified: Secondary | ICD-10-CM

## 2023-03-23 ENCOUNTER — Other Ambulatory Visit: Payer: Self-pay

## 2023-03-23 NOTE — Telephone Encounter (Signed)
Lab Results  Component Value Date   TSH 1.37 01/15/2022   Patient has cpe with labs before scheduled 05/05/23. Ok to refill until then?

## 2023-03-25 MED ORDER — THYROID 30 MG PO TABS: ORAL_TABLET | ORAL | 1 refills | Status: DC

## 2023-03-25 NOTE — Telephone Encounter (Signed)
ERx

## 2023-04-27 ENCOUNTER — Other Ambulatory Visit (INDEPENDENT_AMBULATORY_CARE_PROVIDER_SITE_OTHER): Payer: BC Managed Care – PPO

## 2023-04-27 DIAGNOSIS — E039 Hypothyroidism, unspecified: Secondary | ICD-10-CM

## 2023-04-27 DIAGNOSIS — Z125 Encounter for screening for malignant neoplasm of prostate: Secondary | ICD-10-CM

## 2023-04-27 DIAGNOSIS — E785 Hyperlipidemia, unspecified: Secondary | ICD-10-CM | POA: Diagnosis not present

## 2023-04-27 DIAGNOSIS — R7303 Prediabetes: Secondary | ICD-10-CM

## 2023-04-27 LAB — PSA: PSA: 0.8 ng/mL (ref 0.10–4.00)

## 2023-04-27 LAB — LIPID PANEL
Cholesterol: 196 mg/dL (ref 0–200)
HDL: 41.2 mg/dL (ref >39.00)
LDL Cholesterol: 118 mg/dL — ABNORMAL HIGH (ref 0–99)
NonHDL: 154.75
Total CHOL/HDL Ratio: 5
Triglycerides: 182 mg/dL — ABNORMAL HIGH (ref 0.0–149.0)
VLDL: 36.4 mg/dL (ref 0.0–40.0)

## 2023-04-27 LAB — COMPREHENSIVE METABOLIC PANEL
ALT: 15 U/L (ref 0–53)
AST: 17 U/L (ref 0–37)
Albumin: 4.2 g/dL (ref 3.5–5.2)
Alkaline Phosphatase: 79 U/L (ref 39–117)
BUN: 15 mg/dL (ref 6–23)
CO2: 29 meq/L (ref 19–32)
Calcium: 9.4 mg/dL (ref 8.4–10.5)
Chloride: 103 meq/L (ref 96–112)
Creatinine, Ser: 1.02 mg/dL (ref 0.40–1.50)
GFR: 81.88 mL/min (ref >60.00)
Glucose, Bld: 113 mg/dL — ABNORMAL HIGH (ref 70–99)
Potassium: 5.1 meq/L (ref 3.5–5.1)
Sodium: 140 meq/L (ref 135–145)
Total Bilirubin: 0.4 mg/dL (ref 0.2–1.2)
Total Protein: 7.1 g/dL (ref 6.0–8.3)

## 2023-04-27 LAB — TSH: TSH: 0.91 u[IU]/mL (ref 0.35–5.50)

## 2023-04-27 LAB — HEMOGLOBIN A1C: Hgb A1c MFr Bld: 6.3 % (ref 4.6–6.5)

## 2023-04-27 LAB — T4, FREE: Free T4: 0.57 ng/dL — ABNORMAL LOW (ref 0.60–1.60)

## 2023-04-28 LAB — T3: T3, Total: 123 ng/dL (ref 76–181)

## 2023-05-05 ENCOUNTER — Encounter: Payer: Self-pay | Admitting: Family Medicine

## 2023-05-05 ENCOUNTER — Ambulatory Visit (INDEPENDENT_AMBULATORY_CARE_PROVIDER_SITE_OTHER)
Admission: RE | Admit: 2023-05-05 | Discharge: 2023-05-05 | Disposition: A | Discharge status: Home / Self Care | Payer: BC Managed Care – PPO | Source: Ambulatory Visit | Attending: Family Medicine | Admitting: Family Medicine

## 2023-05-05 ENCOUNTER — Ambulatory Visit: Payer: Medicaid Other | Admitting: Family Medicine

## 2023-05-05 VITALS — BP 140/84 | HR 66 | Temp 97.8°F | Ht 69.0 in | Wt 203.1 lb

## 2023-05-05 DIAGNOSIS — R0689 Other abnormalities of breathing: Secondary | ICD-10-CM | POA: Insufficient documentation

## 2023-05-05 DIAGNOSIS — R7303 Prediabetes: Secondary | ICD-10-CM

## 2023-05-05 DIAGNOSIS — Z860101 Personal history of adenomatous and serrated colon polyps: Secondary | ICD-10-CM

## 2023-05-05 DIAGNOSIS — R918 Other nonspecific abnormal finding of lung field: Secondary | ICD-10-CM | POA: Diagnosis not present

## 2023-05-05 DIAGNOSIS — E039 Hypothyroidism, unspecified: Secondary | ICD-10-CM

## 2023-05-05 DIAGNOSIS — Z0000 Encounter for general adult medical examination without abnormal findings: Secondary | ICD-10-CM | POA: Diagnosis not present

## 2023-05-05 DIAGNOSIS — E785 Hyperlipidemia, unspecified: Secondary | ICD-10-CM

## 2023-05-05 DIAGNOSIS — E66811 Obesity, class 1: Secondary | ICD-10-CM

## 2023-05-05 DIAGNOSIS — Z87891 Personal history of nicotine dependence: Secondary | ICD-10-CM | POA: Diagnosis not present

## 2023-05-05 DIAGNOSIS — F1290 Cannabis use, unspecified, uncomplicated: Secondary | ICD-10-CM

## 2023-05-05 DIAGNOSIS — N529 Male erectile dysfunction, unspecified: Secondary | ICD-10-CM

## 2023-05-05 DIAGNOSIS — J439 Emphysema, unspecified: Secondary | ICD-10-CM | POA: Diagnosis not present

## 2023-05-05 DIAGNOSIS — R0989 Other specified symptoms and signs involving the circulatory and respiratory systems: Secondary | ICD-10-CM

## 2023-05-05 DIAGNOSIS — J479 Bronchiectasis, uncomplicated: Secondary | ICD-10-CM | POA: Diagnosis not present

## 2023-05-05 DIAGNOSIS — B351 Tinea unguium: Secondary | ICD-10-CM

## 2023-05-05 MED ORDER — THYROID 90 MG PO TABS: ORAL_TABLET | ORAL | 4 refills | Status: DC

## 2023-05-05 MED ORDER — TERBINAFINE HCL 250 MG PO TABS: 250.0000 mg | ORAL_TABLET | Freq: Every day | ORAL | 0 refills | Status: AC

## 2023-05-05 MED ORDER — THYROID 30 MG PO TABS: ORAL_TABLET | ORAL | 4 refills | Status: DC

## 2023-05-05 MED ORDER — SILDENAFIL CITRATE 100 MG PO TABS: 50.0000 mg | ORAL_TABLET | ORAL | 9 refills | Status: DC | PRN

## 2023-05-05 NOTE — Assessment & Plan Note (Signed)
Preventative protocols reviewed and updated unless pt declined. Discussed healthy diet and lifestyle.  

## 2023-05-05 NOTE — Progress Notes (Signed)
Ph: [phone] Fax: [phone]   Patient ID: Malik Perry, male    DOB: 1965/08/19, 57 y.o.   MRN: [id number]  This visit was conducted in person.  BP (!) 140/84 (BP Location: Right Arm, Cuff Size: Normal)   Pulse 66   Temp 97.8 F (36.6 C) (Oral)   Ht 5\' 9"  (1.753 m)   Wt 203 lb 2 oz (92.1 kg)   SpO2 95%   BMI 30.00 kg/m   BP Readings from Last 3 Encounters:  05/05/23 (!) 140/84  01/15/22 118/80  10/08/20 114/60   CC: CPE Subjective:   HPI: Malik Perry is a 57 y.o. male presenting on 05/05/2023 for Annual Exam   Hypothyroidism - regular dose of armour thyroid is 90mg  daily, 120mg  MWFS. Strong fmhx thyroid cancer. Did not tolerate levothyroxine.   ED - viagra 100mg  working well.   Cyclic vomiting syndrome - no recent trouble with this in the past 2 years.   Onychomycosis - funginail ineffective. Jublia was effective but insurance stopped covering after 2 fills. Ciclopirox was not covered either. Agrees to try oral terbinafine 6 wk course - LFTs stable.   Preventative: COLONOSCOPY 06/2017 - 3 TA, diverticulosis, rpt 3 yrs [facility]) - overdue for this.  Prostate screening - yearly PSA. Nocturia x1.  Lung cancer screening - 40+ PY hx - referred last year, declines today.  Flu declines  COVID vaccine Pfizer 09/2019, 10/2019, no booster Tdap 2014 - declines repeat  Shingrix - discussed - declines  Seat belt use discussed.  Sunscreen use discussed. No changing moles on skin.  Ex-smoker - 1 ppd, started age 53. Fully quit smoking 05/2021.  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 Interim medical history since our last visit reviewed. Allergies and medications reviewed and updated. Outpatient Medications Prior to Visit  Medication Sig Dispense Refill   promethazine (PHENERGAN) 25 MG tablet Take 1 tablet (25 mg total) by mouth every 8 (eight) hours as needed for nausea or vomiting. 20 tablet 1   sildenafil (VIAGRA) 100 MG tablet Take 0.5-1 tablets (50-100 mg total) by mouth as needed for erectile dysfunction. 9 tablet 9   thyroid (ARMOUR THYROID) 30 MG tablet TAKE 1 TABLET 4 DAYS A WEEK AS DIRECTED ON MON, WED, FRI, SAT (TAKE WITH 90MG  DOSE) 50 tablet 1   thyroid (NP THYROID) 90 MG tablet TAKE 1 TABLET BY MOUTH EVERY DAY BEFORE BREAKFAST 90 tablet 0   ciclopirox (PENLAC) 8 % solution Apply topically at bedtime. Apply over nail and surrounding skin. Apply daily over previous coat. After seven (7) days, may remove with alcohol and continue cycle. (Patient not taking: Reported on 05/05/2023) 6.6 mL 1   No facility-administered medications prior to visit.     Per HPI unless specifically indicated in ROS section below Review of Systems  Constitutional:  Negative for activity change, appetite change, chills, fatigue, fever and unexpected weight change.  HENT:  Negative for hearing loss.   Eyes:  Negative for visual disturbance.  Respiratory:  Negative for cough, chest tightness, shortness of breath and wheezing.   Cardiovascular:  Negative for chest pain, palpitations and leg swelling.  Gastrointestinal:  Negative for abdominal distention, abdominal pain, blood in stool, constipation, diarrhea, nausea and vomiting.  Genitourinary:  Negative for difficulty urinating and hematuria.  Musculoskeletal:  Negative for arthralgias, myalgias and neck pain.  Skin:  Negative for rash.  Neurological:  Positive for headaches (L sided). Negative for dizziness, seizures and syncope.  Hematological:  Negative  for adenopathy. Bruises/bleeds easily.  Psychiatric/Behavioral:  Negative for dysphoric mood. The patient is not nervous/anxious.     Objective:  BP (!) 140/84 (BP Location: Right Arm, Cuff Size: Normal)   Pulse 66   Temp 97.8 F (36.6 C) (Oral)   Ht 5\' 9"  (1.753 m)   Wt 203 lb 2 oz (92.1 kg)   SpO2 95%   BMI 30.00 kg/m   Wt Readings from Last 3 Encounters:  05/05/23 203 lb 2 oz (92.1 kg)  01/15/22 179 lb 2 oz (81.3 kg)  10/08/20 181 lb 3 oz (82.2 kg)      Physical Exam Vitals and nursing note reviewed.  Constitutional:      General: He is not in acute distress.    Appearance: Normal appearance. He is well-developed. He is not ill-appearing.  HENT:     Head: Normocephalic and atraumatic.     Right Ear: Hearing, tympanic membrane, ear canal and external ear normal.     Left Ear: Hearing, tympanic membrane, ear canal and external ear normal.     Mouth/Throat:     Mouth: Mucous membranes are moist.     Pharynx: Oropharynx is clear. No oropharyngeal exudate or posterior oropharyngeal erythema.  Eyes:     General: No scleral icterus.    Extraocular Movements: Extraocular movements intact.     Conjunctiva/sclera: Conjunctivae normal.     Pupils: Pupils are equal, round, and reactive to light.  Neck:     Thyroid: No thyroid mass or thyromegaly.  Cardiovascular:     Rate and Rhythm: Normal rate and regular rhythm.     Pulses: Normal pulses.          Radial pulses are 2+ on the right side and 2+ on the left side.     Heart sounds: Normal heart sounds. No murmur heard. Pulmonary:     Effort: Pulmonary effort is normal. No respiratory distress.     Breath sounds: Rales (LLL) present. No wheezing or rhonchi.  Chest:     Chest wall: No tenderness.  Abdominal:     General: Bowel sounds are normal. There is no distension.     Palpations: Abdomen is soft. There is no mass.     Tenderness: There is no abdominal tenderness. There is no guarding or rebound.     Hernia: No hernia is  present.  Musculoskeletal:        General: Normal range of motion.     Cervical back: Normal range of motion and neck supple.     Right lower leg: No edema.     Left lower leg: No edema.  Lymphadenopathy:     Cervical: No cervical adenopathy.  Skin:    General: Skin is warm and dry.     Findings: No rash.     Comments:  R great toenail elongated, thickened, R 5th toenail also affected to a lesser extent  Neurological:     General: No focal deficit present.     Mental Status: He is alert  and oriented to person, place, and time.  Psychiatric:        Mood and Affect: Mood normal.        Behavior: Behavior normal.        Thought Content: Thought content normal.        Judgment: Judgment normal.       Results for orders placed or performed in visit on 04/27/23  PSA  Result Value Ref Range   PSA 0.80 0.10 - 4.00 ng/mL  T4, free  Result Value Ref Range   Free T4 0.57 (L) 0.60 - 1.60 ng/dL  T3  Result Value Ref Range   T3, Total 123 76 - 181 ng/dL  TSH  Result Value Ref Range   TSH 0.91 0.35 - 5.50 uIU/mL  Hemoglobin A1c  Result Value Ref Range   Hgb A1c MFr Bld 6.3 4.6 - 6.5 %  Comprehensive metabolic panel  Result Value Ref Range   Sodium 140 135 - 145 mEq/L   Potassium 5.1 3.5 - 5.1 mEq/L   Chloride 103 96 - 112 mEq/L   CO2 29 19 - 32 mEq/L   Glucose, Bld 113 (H) 70 - 99 mg/dL   BUN 15 6 - 23 mg/dL   Creatinine, Ser 1.61 0.40 - 1.50 mg/dL   Total Bilirubin 0.4 0.2 - 1.2 mg/dL   Alkaline Phosphatase 79 39 - 117 U/L   AST 17 0 - 37 U/L   ALT 15 0 - 53 U/L   Total Protein 7.1 6.0 - 8.3 g/dL   Albumin 4.2 3.5 - 5.2 g/dL   GFR 09.60 >45.40 mL/min   Calcium 9.4 8.4 - 10.5 mg/dL  Lipid panel  Result Value Ref Range   Cholesterol 196 0 - 200 mg/dL   Triglycerides 981.1 (H) 0.0 - 149.0 mg/dL   HDL 91.47 >82.95 mg/dL   VLDL 62.1 0.0 - 30.8 mg/dL   LDL Cholesterol 657 (H) 0 - 99 mg/dL   Total CHOL/HDL Ratio 5    NonHDL 154.75     Assessment & Plan:   Problem List  Items Addressed This Visit     Health maintenance examination - Primary (Chronic)    Preventative protocols reviewed and updated unless pt declined. Discussed healthy diet and lifestyle.       Ex-smoker    Remains abstinent, quit 05/2021.  Offered lung cancer screening, pt declines.       Hypothyroidism    Chronic, stable on current armour thyroid dosing.       Relevant Medications   thyroid (ARMOUR THYROID) 30 MG tablet   thyroid (NP THYROID) 90 MG tablet   Cannabis use, uncomplicated    Has cut down. Encouraged full cessation.       Onychomycosis    To R great toenail.  Rx terbinafine 250mg  daily x 6 wks, with rpt course if needed. Will need LFTs checked if course needs repeating.  Jublia, Cicloporix not covered by insurance.       Relevant Medications   terbinafine (LAMISIL) 250 MG tablet   Erectile dysfunction    Continue viagra PRN - effective      Dyslipidemia    Reviewed diet choices to improve cholesterol levels. The 10-year ASCVD risk score (Arnett DK, et al., 2019) is: 9.1%   Values used to calculate the score:     Age: 58 years     Sex: Male     Is Non-Hispanic African American: No     Diabetic: No  Tobacco smoker: No     Systolic Blood Pressure: 142 mmHg     Is BP treated: No     HDL Cholesterol: 41.2 mg/dL     Total Cholesterol: 196 mg/dL       Hx of adenomatous colonic polyps    Overdue for f/u - # provided for pt to call and schedule.       Prediabetes    Encouraged limiting added sugar in diet.  Discussed regular soda intake.      Abnormal breath sounds    LLL rales noted today. Pt asxs.  In smoker, check CXR today.       Relevant Orders   DG Chest 2 View   Obesity, Class I, BMI 30-34.9    Discussed healthy diet choices to affect sustainable weight loss.         Meds ordered this encounter  Medications   thyroid (ARMOUR THYROID) 30 MG tablet    Sig: TAKE 1 TABLET 4 DAYS A WEEK AS DIRECTED ON MON, WED, FRI, SAT (TAKE WITH  90MG  DOSE)    Dispense:  50 tablet    Refill:  4   thyroid (NP THYROID) 90 MG tablet    Sig: TAKE 1 TABLET BY MOUTH EVERY DAY BEFORE BREAKFAST    Dispense:  90 tablet    Refill:  4   sildenafil (VIAGRA) 100 MG tablet    Sig: Take 0.5-1 tablets (50-100 mg total) by mouth as needed for erectile dysfunction.    Dispense:  9 tablet    Refill:  9   terbinafine (LAMISIL) 250 MG tablet    Sig: Take 1 tablet (250 mg total) by mouth daily.    Dispense:  45 tablet    Refill:  0    Orders Placed This Encounter  Procedures   DG Chest 2 View    Standing Status:   Future    Number of Occurrences:   1    Standing Expiration Date:   05/04/2024    Order Specific Question:   Reason for Exam (SYMPTOM  OR DIAGNOSIS REQUIRED)    Answer:   LLL rales ex smoker pt asxs    Order Specific Question:   Preferred imaging location?    Answer:   [facility]    Patient Instructions  Xray today  Take terbinafine 250mg  one tablet daily for 6 weeks. Let us know how toenail does after this. If 2nd course needed, we will recheck liver function prior to starting.  You may call [facility] at [phone] to schedule colonoscopy. Schedule dental appointment Consider tetanus, shingles shots.  Return as needed or in 1 year for next physical.   Follow up plan: Return in about 1 year (around 05/04/2024) for annual exam, prior fasting for blood work.  Eustaquio Boyden, MD

## 2023-05-05 NOTE — Patient Instructions (Addendum)
Xray today  Take terbinafine 250mg  one tablet daily for 6 weeks. Let us know how toenail does after this. If 2nd course needed, we will recheck liver function prior to starting.  You may call  GI at [phone] to schedule colonoscopy. Schedule dental appointment Consider tetanus, shingles shots.  Return as needed or in 1 year for next physical.

## 2023-05-05 NOTE — Assessment & Plan Note (Addendum)
Encouraged limiting added sugar in diet.  Discussed regular soda intake.

## 2023-05-05 NOTE — Assessment & Plan Note (Addendum)
Remains abstinent, quit 05/2021.  Offered lung cancer screening, pt declines.

## 2023-05-05 NOTE — Assessment & Plan Note (Signed)
Overdue for f/u - # provided for pt to call and schedule.

## 2023-05-05 NOTE — Assessment & Plan Note (Signed)
Reviewed diet choices to improve cholesterol levels. The 10-year ASCVD risk score (Arnett DK, et al., 2019) is: 9.1%   Values used to calculate the score:     Age: 57 years     Sex: Male     Is Non-Hispanic African American: No     Diabetic: No     Tobacco smoker: No     Systolic Blood Pressure: 142 mmHg     Is BP treated: No     HDL Cholesterol: 41.2 mg/dL     Total Cholesterol: 196 mg/dL

## 2023-05-05 NOTE — Assessment & Plan Note (Signed)
Continue viagra PRN - effective

## 2023-05-05 NOTE — Assessment & Plan Note (Signed)
Chronic, stable on current armour thyroid dosing.

## 2023-05-05 NOTE — Assessment & Plan Note (Signed)
LLL rales noted today. Pt asxs.  In smoker, check CXR today.

## 2023-05-05 NOTE — Assessment & Plan Note (Addendum)
Discussed healthy diet choices to affect sustainable weight loss.

## 2023-05-05 NOTE — Assessment & Plan Note (Addendum)
To R great toenail.  Rx terbinafine 250mg  daily x 6 wks, with rpt course if needed. Will need LFTs checked if course needs repeating.  Jublia, Cicloporix not covered by insurance.

## 2023-05-05 NOTE — Assessment & Plan Note (Signed)
Has cut down. Encouraged full cessation.

## 2023-05-19 ENCOUNTER — Encounter: Payer: Self-pay | Admitting: Family Medicine

## 2023-05-19 ENCOUNTER — Other Ambulatory Visit: Payer: Self-pay | Admitting: Family Medicine

## 2023-05-19 ENCOUNTER — Telehealth: Payer: Self-pay

## 2023-05-19 DIAGNOSIS — J449 Chronic obstructive pulmonary disease, unspecified: Secondary | ICD-10-CM | POA: Insufficient documentation

## 2023-05-19 DIAGNOSIS — E039 Hypothyroidism, unspecified: Secondary | ICD-10-CM

## 2023-05-19 NOTE — Telephone Encounter (Addendum)
Noted  

## 2023-05-19 NOTE — Telephone Encounter (Signed)
Patient called in returning call he received. Relayed message below to patient. He stated that he will follow up if any symptoms develop.

## 2023-05-19 NOTE — Telephone Encounter (Signed)
Lvm asking pt to call back. Need to relay x-ray results and Dr Timoteo Expose message (see Imaging, Result Notes- 05/05/23).   X-ray/Dr G's msg: Your chest xray was read yesterday. It did show some COPD (emphysema) changes to lungs as well as bronchial wall thickening, which could indicate bronchitis or other infection. Let us know if you develop any cough, fever or other respiratory symptoms. You should strongly consider undergoing lung cancer screening CT scan program, given ex-smoking history. If you're interested, I can place referral. (He previously declined). This should be covered by your insurance.

## 2023-08-20 ENCOUNTER — Telehealth: Payer: Self-pay

## 2023-08-20 NOTE — Transitions of Care (Post Inpatient/ED Visit) (Signed)
   08/20/2023  Name: Malik Perry MRN: [id number] DOB: 1965-11-03  Today's TOC FU Call Status: Today's TOC FU Call Status:: Unsuccessful Call (1st Attempt) Unsuccessful Call (1st Attempt) Date: 08/20/23  Attempted to reach the patient regarding the most recent Inpatient/ED visit.  Follow Up Plan: Additional outreach attempts will be made to reach the patient to complete the Transitions of Care (Post Inpatient/ED visit) call.   Deidre Ala, BSN, RN [place]  [facility] - [employer] [facility] RN Care Manager [phone]

## 2024-04-06 ENCOUNTER — Telehealth: Payer: Self-pay | Admitting: Family Medicine

## 2024-04-06 NOTE — Telephone Encounter (Signed)
 LVM to Schedule for CPE Labs one week prior to appt 12/22  please put orders in   Copied from CRM #[id number]. Topic: General  - Other >> Apr 06, 2024  8:13 AM Aleatha C wrote: Reason for CRM: Patient wanted to know if he needs to do labs prior to his annual physical please call to let him know

## 2024-05-18 ENCOUNTER — Other Ambulatory Visit: Payer: Self-pay | Admitting: Family Medicine

## 2024-05-18 DIAGNOSIS — E039 Hypothyroidism, unspecified: Secondary | ICD-10-CM

## 2024-05-27 ENCOUNTER — Other Ambulatory Visit: Payer: Self-pay | Admitting: Family Medicine

## 2024-05-27 DIAGNOSIS — E785 Hyperlipidemia, unspecified: Secondary | ICD-10-CM

## 2024-05-27 DIAGNOSIS — R7303 Prediabetes: Secondary | ICD-10-CM

## 2024-05-27 DIAGNOSIS — K760 Fatty (change of) liver, not elsewhere classified: Secondary | ICD-10-CM

## 2024-05-27 DIAGNOSIS — Z125 Encounter for screening for malignant neoplasm of prostate: Secondary | ICD-10-CM

## 2024-05-27 DIAGNOSIS — E039 Hypothyroidism, unspecified: Secondary | ICD-10-CM

## 2024-05-30 ENCOUNTER — Other Ambulatory Visit (INDEPENDENT_AMBULATORY_CARE_PROVIDER_SITE_OTHER)

## 2024-05-30 DIAGNOSIS — E039 Hypothyroidism, unspecified: Secondary | ICD-10-CM | POA: Diagnosis not present

## 2024-05-30 DIAGNOSIS — Z125 Encounter for screening for malignant neoplasm of prostate: Secondary | ICD-10-CM

## 2024-05-30 DIAGNOSIS — K760 Fatty (change of) liver, not elsewhere classified: Secondary | ICD-10-CM | POA: Diagnosis not present

## 2024-05-30 DIAGNOSIS — E785 Hyperlipidemia, unspecified: Secondary | ICD-10-CM

## 2024-05-30 DIAGNOSIS — R7303 Prediabetes: Secondary | ICD-10-CM

## 2024-05-30 LAB — CBC WITH DIFFERENTIAL/PLATELET
Basophils Absolute: 0.1 K/uL (ref 0.0–0.1)
Basophils Relative: 0.7 % (ref 0.0–3.0)
Eosinophils Absolute: 0.3 K/uL (ref 0.0–0.7)
Eosinophils Relative: 2.8 % (ref 0.0–5.0)
HCT: 45.5 % (ref 39.0–52.0)
Hemoglobin: 15.3 g/dL (ref 13.0–17.0)
Lymphocytes Relative: 30.7 % (ref 12.0–46.0)
Lymphs Abs: 2.7 K/uL (ref 0.7–4.0)
MCHC: 33.7 g/dL (ref 30.0–36.0)
MCV: 90.2 fl (ref 78.0–100.0)
Monocytes Absolute: 0.8 K/uL (ref 0.1–1.0)
Monocytes Relative: 9.2 % (ref 3.0–12.0)
Neutro Abs: 5 K/uL (ref 1.4–7.7)
Neutrophils Relative %: 56.6 % (ref 43.0–77.0)
Platelets: 332 K/uL (ref 150.0–400.0)
RBC: 5.04 Mil/uL (ref 4.22–5.81)
RDW: 13.6 % (ref 11.5–15.5)
WBC: 8.9 K/uL (ref 4.0–10.5)

## 2024-05-30 LAB — COMPREHENSIVE METABOLIC PANEL WITH GFR
ALT: 18 U/L (ref 3–53)
AST: 16 U/L (ref 5–37)
Albumin: 4.3 g/dL (ref 3.5–5.2)
Alkaline Phosphatase: 75 U/L (ref 39–117)
BUN: 20 mg/dL (ref 6–23)
CO2: 29 meq/L (ref 19–32)
Calcium: 9.2 mg/dL (ref 8.4–10.5)
Chloride: 102 meq/L (ref 96–112)
Creatinine, Ser: 1.02 mg/dL (ref 0.40–1.50)
GFR: 81.25 mL/min
Glucose, Bld: 114 mg/dL — ABNORMAL HIGH (ref 70–99)
Potassium: 4.8 meq/L (ref 3.5–5.1)
Sodium: 137 meq/L (ref 135–145)
Total Bilirubin: 0.4 mg/dL (ref 0.2–1.2)
Total Protein: 7.3 g/dL (ref 6.0–8.3)

## 2024-05-30 LAB — LIPID PANEL
Cholesterol: 167 mg/dL (ref 28–200)
HDL: 38.3 mg/dL — ABNORMAL LOW
LDL Cholesterol: 95 mg/dL (ref 10–99)
NonHDL: 129.13
Total CHOL/HDL Ratio: 4
Triglycerides: 169 mg/dL — ABNORMAL HIGH (ref 10.0–149.0)
VLDL: 33.8 mg/dL (ref 0.0–40.0)

## 2024-05-30 LAB — TSH: TSH: 1.02 u[IU]/mL (ref 0.35–5.50)

## 2024-05-30 LAB — T4, FREE: Free T4: 0.62 ng/dL (ref 0.60–1.60)

## 2024-05-30 LAB — HEMOGLOBIN A1C: Hgb A1c MFr Bld: 6.3 % (ref 4.6–6.5)

## 2024-05-30 LAB — PSA: PSA: 0.7 ng/mL (ref 0.10–4.00)

## 2024-05-31 ENCOUNTER — Ambulatory Visit: Payer: Self-pay | Admitting: Family Medicine

## 2024-05-31 LAB — T3: T3, Total: 241 ng/dL — ABNORMAL HIGH (ref 76–181)

## 2024-06-06 ENCOUNTER — Encounter: Payer: Self-pay | Admitting: Family Medicine

## 2024-06-06 ENCOUNTER — Ambulatory Visit (INDEPENDENT_AMBULATORY_CARE_PROVIDER_SITE_OTHER): Admitting: Family Medicine

## 2024-06-06 VITALS — BP 120/80 | HR 70 | Temp 98.1°F | Ht 68.0 in | Wt 210.0 lb

## 2024-06-06 DIAGNOSIS — Z0000 Encounter for general adult medical examination without abnormal findings: Secondary | ICD-10-CM

## 2024-06-06 DIAGNOSIS — R0989 Other specified symptoms and signs involving the circulatory and respiratory systems: Secondary | ICD-10-CM | POA: Diagnosis not present

## 2024-06-06 DIAGNOSIS — E66811 Obesity, class 1: Secondary | ICD-10-CM | POA: Diagnosis not present

## 2024-06-06 DIAGNOSIS — Z87891 Personal history of nicotine dependence: Secondary | ICD-10-CM | POA: Diagnosis not present

## 2024-06-06 DIAGNOSIS — R7303 Prediabetes: Secondary | ICD-10-CM | POA: Diagnosis not present

## 2024-06-06 DIAGNOSIS — E785 Hyperlipidemia, unspecified: Secondary | ICD-10-CM | POA: Diagnosis not present

## 2024-06-06 DIAGNOSIS — Z860101 Personal history of adenomatous and serrated colon polyps: Secondary | ICD-10-CM

## 2024-06-06 DIAGNOSIS — N529 Male erectile dysfunction, unspecified: Secondary | ICD-10-CM

## 2024-06-06 DIAGNOSIS — Z23 Encounter for immunization: Secondary | ICD-10-CM

## 2024-06-06 DIAGNOSIS — E039 Hypothyroidism, unspecified: Secondary | ICD-10-CM | POA: Diagnosis not present

## 2024-06-06 DIAGNOSIS — Z1211 Encounter for screening for malignant neoplasm of colon: Secondary | ICD-10-CM | POA: Diagnosis not present

## 2024-06-06 MED ORDER — SILDENAFIL CITRATE 100 MG PO TABS: 50.0000 mg | ORAL_TABLET | ORAL | 9 refills | Status: AC | PRN

## 2024-06-06 MED ORDER — THYROID 30 MG PO TABS: ORAL_TABLET | ORAL | 3 refills | Status: AC

## 2024-06-06 MED ORDER — THYROID 90 MG PO TABS: ORAL_TABLET | ORAL | 3 refills | Status: AC

## 2024-06-06 MED ORDER — AZITHROMYCIN 250 MG PO TABS: ORAL_TABLET | ORAL | 0 refills | Status: AC

## 2024-06-06 NOTE — Progress Notes (Signed)
 " Ph: [phone] Fax: [phone]   Patient ID: Malik Perry, male    DOB: Jul 01, 1965, 58 y.o.   MRN: [id number]  This visit was conducted in person.  BP 120/80 (Cuff Size: Normal)   Pulse 70   Temp 98.1 F (36.7 C) (Oral)   Ht 5' 8 (1.727 m)   Wt 210 lb (95.3 kg)   SpO2 95%   BMI 31.93 kg/m    CC: CPE Subjective:   HPI: Malik Perry is a 58 y.o. male presenting on 06/06/2024 for Annual Exam (Wants Tdap vax, declined all other shots/Declined lung screening/Wants info for colonoscopy)  . Hypothyroidism - regular dose of armour thyroid  is 90mg  daily, 120mg  MWFS. Strong fmhx thyroid  cancer. Did not tolerate levothyroxine .    ED - viagra  100mg  working well.   Onychomycosis - funginail ineffective. Jublia  was effective but insurance stopped covering after 2 fills. Ciclopirox  was not covered either. Oral terbinafine  6 wk course.   No known fmhx ILD or PF.   Preventative: COLONOSCOPY 06/2017 - 3 TA, diverticulosis, rpt 3 yrs Ollen) - overdue for this.  Prostate screening - yearly PSA. Nocturia x1.  Lung cancer screening - 40+ PY hx - referred last year, declines today.  Flu declines  COVID vaccine Pfizer 09/2019, 10/2019, no booster Prevnar-20 - discussed, declines Tdap 2014, update Tdap  Shingrix - discussed - declines  Seat belt use discussed.  Sunscreen use discussed. No changing moles on skin.  Ex-smoker - 1 ppd, started age 84. Fully quit smoking 05/2021.  Alcohol - none  Rec drugs - has decreased MJ use to every few months.  Dentist - overdue - known gum disease. Brushes teeth once daily.  Eye exam - hasn't seen, denies vision changes  Bowel - no constipation   Lives with fiancee, and cat Son near [place] Occupation: helps out with construction  Edu: HS  Activity: no regular exercise  Diet: some water some sodas, down to 1-2, fruits/vegetables regularly     Relevant past medical, surgical, family and social history reviewed and updated as  indicated. Interim medical history since our last visit reviewed. Allergies and medications reviewed and updated. Outpatient Medications Prior to Visit  Medication Sig Dispense Refill   sildenafil  (VIAGRA ) 100 MG tablet Take 0.5-1 tablets (50-100 mg total) by mouth as needed for erectile dysfunction. 9 tablet 9   thyroid  (ARMOUR THYROID ) 30 MG tablet TAKE 1 TABLET BY MOUTH 4 DAYS A WEEK AS DIRECTION ON MONDAY, WEDNESDAY, FRIDAY, SATURDAY 50 tablet 4   thyroid  (ARMOUR) 90 MG tablet TAKE 1 TABLET BY MOUTH EVERY DAY BEFORE BREAKFAST 90 tablet 4   terbinafine  (LAMISIL ) 250 MG tablet Take 1 tablet (250 mg total) by mouth daily. (Patient not taking: Reported on 06/06/2024) 45 tablet 0   No facility-administered medications prior to visit.     Per HPI unless specifically indicated in ROS section below Review of Systems  Constitutional:  Negative for activity change, appetite change, chills, fatigue, fever and unexpected weight change.  HENT:  Negative for hearing loss.   Eyes:  Negative for visual disturbance.  Respiratory:  Negative for cough, chest tightness, shortness of breath and wheezing.   Cardiovascular:  Negative for chest pain, palpitations and leg swelling.  Gastrointestinal:  Negative for abdominal distention, abdominal pain, blood in stool, constipation, diarrhea, nausea and vomiting.  Genitourinary:  Negative for difficulty urinating and hematuria.  Musculoskeletal:  Positive for arthralgias (bilat knees). Negative for myalgias and neck pain.  Skin:  Negative  for rash.  Neurological:  Negative for dizziness, seizures, syncope and headaches.  Hematological:  Negative for adenopathy. Does not bruise/bleed easily.  Psychiatric/Behavioral:  Negative for dysphoric mood. The patient is not nervous/anxious.     Objective:  BP 120/80 (Cuff Size: Normal)   Pulse 70   Temp 98.1 F (36.7 C) (Oral)   Ht 5' 8 (1.727 m)   Wt 210 lb (95.3 kg)   SpO2 95%   BMI 31.93 kg/m   Wt Readings  from Last 3 Encounters:  06/06/24 210 lb (95.3 kg)  05/05/23 203 lb 2 oz (92.1 kg)  01/15/22 179 lb 2 oz (81.3 kg)      Physical Exam Vitals and nursing note reviewed.  Constitutional:      General: He is not in acute distress.    Appearance: Normal appearance. He is well-developed. He is not ill-appearing.  HENT:     Head: Normocephalic and atraumatic.     Right Ear: Hearing, tympanic membrane, ear canal and external ear normal.     Left Ear: Hearing, tympanic membrane, ear canal and external ear normal.     Mouth/Throat:     Mouth: Mucous membranes are moist.     Pharynx: Oropharynx is clear. No oropharyngeal exudate or posterior oropharyngeal erythema.  Eyes:     General: No scleral icterus.    Extraocular Movements: Extraocular movements intact.     Conjunctiva/sclera: Conjunctivae normal.     Pupils: Pupils are equal, round, and reactive to light.  Neck:     Thyroid : No thyroid  mass or thyromegaly.  Cardiovascular:     Rate and Rhythm: Normal rate and regular rhythm.     Pulses: Normal pulses.          Radial pulses are 2+ on the right side and 2+ on the left side.     Heart sounds: Normal heart sounds. No murmur heard. Pulmonary:     Effort: Pulmonary effort is normal. No respiratory distress.     Breath sounds: Normal breath sounds. No wheezing, rhonchi or rales.  Abdominal:     General: Bowel sounds are normal. There is no distension.     Palpations: Abdomen is soft. There is no mass.     Tenderness: There is no abdominal tenderness. There is no guarding or rebound.     Hernia: No hernia is present.  Musculoskeletal:        General: Normal range of motion.     Cervical back: Normal range of motion and neck supple.     Right lower leg: No edema.     Left lower leg: No edema.  Lymphadenopathy:     Cervical: No cervical adenopathy.  Skin:    General: Skin is warm and dry.     Findings: No rash.  Neurological:     General: No focal deficit present.     Mental  Status: He is alert and oriented to person, place, and time.  Psychiatric:        Mood and Affect: Mood normal.        Behavior: Behavior normal.        Thought Content: Thought content normal.        Judgment: Judgment normal.       Results for orders placed or performed in visit on 05/30/24  T4, free   Collection Time: 05/30/24  7:42 AM  Result Value Ref Range   Free T4 0.62 0.60 - 1.60 ng/dL  T3   Collection Time: 05/30/24  7:42 AM  Result Value Ref Range   T3, Total 241 (H) 76 - 181 ng/dL  Hemoglobin J8r   Collection Time: 05/30/24  7:42 AM  Result Value Ref Range   Hgb A1c MFr Bld 6.3 4.6 - 6.5 %  TSH   Collection Time: 05/30/24  7:42 AM  Result Value Ref Range   TSH 1.02 0.35 - 5.50 uIU/mL  CBC with Differential/Platelet   Collection Time: 05/30/24  7:42 AM  Result Value Ref Range   WBC 8.9 4.0 - 10.5 K/uL   RBC 5.04 4.22 - 5.81 Mil/uL   Hemoglobin 15.3 13.0 - 17.0 g/dL   HCT 54.4 60.9 - 47.9 %   MCV 90.2 78.0 - 100.0 fl   MCHC 33.7 30.0 - 36.0 g/dL   RDW 86.3 88.4 - 84.4 %   Platelets 332.0 150.0 - 400.0 K/uL   Neutrophils Relative % 56.6 43.0 - 77.0 %   Lymphocytes Relative 30.7 12.0 - 46.0 %   Monocytes Relative 9.2 3.0 - 12.0 %   Eosinophils Relative 2.8 0.0 - 5.0 %   Basophils Relative 0.7 0.0 - 3.0 %   Neutro Abs 5.0 1.4 - 7.7 K/uL   Lymphs Abs 2.7 0.7 - 4.0 K/uL   Monocytes Absolute 0.8 0.1 - 1.0 K/uL   Eosinophils Absolute 0.3 0.0 - 0.7 K/uL   Basophils Absolute 0.1 0.0 - 0.1 K/uL  PSA   Collection Time: 05/30/24  7:42 AM  Result Value Ref Range   PSA 0.70 0.10 - 4.00 ng/mL  Comprehensive metabolic panel with GFR   Collection Time: 05/30/24  7:42 AM  Result Value Ref Range   Sodium 137 135 - 145 mEq/L   Potassium 4.8 3.5 - 5.1 mEq/L   Chloride 102 96 - 112 mEq/L   CO2 29 19 - 32 mEq/L   Glucose, Bld 114 (H) 70 - 99 mg/dL   BUN 20 6 - 23 mg/dL   Creatinine, Ser 8.97 0.40 - 1.50 mg/dL   Total Bilirubin 0.4 0.2 - 1.2 mg/dL   Alkaline  Phosphatase 75 39 - 117 U/L   AST 16 5 - 37 U/L   ALT 18 3 - 53 U/L   Total Protein 7.3 6.0 - 8.3 g/dL   Albumin 4.3 3.5 - 5.2 g/dL   GFR 18.74 >39.99 mL/min   Calcium 9.2 8.4 - 10.5 mg/dL  Lipid panel   Collection Time: 05/30/24  7:42 AM  Result Value Ref Range   Cholesterol 167 28 - 200 mg/dL   Triglycerides 830.9 (H) 10.0 - 149.0 mg/dL   HDL 61.69 (L) >60.99 mg/dL   VLDL 66.1 0.0 - 59.9 mg/dL   LDL Cholesterol 95 10 - 99 mg/dL   Total CHOL/HDL Ratio 4    NonHDL 129.13    DG Chest 2 View CLINICAL DATA:  58 year old male with lung findings on physical exam  EXAM: CHEST - 2 VIEW  COMPARISON:  06/14/2019  FINDINGS: Cardiomediastinal silhouette unchanged in size and contour. No evidence of central vascular congestion. No interlobular septal thickening.  Stigmata of emphysema, with increased retrosternal airspace, flattened hemidiaphragms, increased AP diameter, and hyperinflation on the AP view.  Bronchiectasis and bronchial wall thickening with some right greater than left reticulonodular opacities.  No pneumothorax or pleural effusion. Coarsened interstitial markings.  No acute displaced fracture. Degenerative changes of the spine.  IMPRESSION: Bronchial wall thickening and vague reticulonodular opacities, potentially bronchitis or other atypical infection.  Emphysema  Electronically Signed   By: Jaime  Wagner D.O.  On: 05/18/2023 14:22   Assessment & Plan:   Problem List Items Addressed This Visit     Health maintenance examination - Primary (Chronic)   Preventative protocols reviewed and updated unless pt declined. Discussed healthy diet and lifestyle.       Ex-smoker   Again declines lung cancer screening CT      Hypothyroidism   Chronic, stable on current armour thyroid  dosing - continue       Relevant Medications   thyroid  (ARMOUR THYROID ) 30 MG tablet   thyroid  (ARMOUR) 90 MG tablet   Erectile dysfunction   Refill viagra        Dyslipidemia   Reviewed diet choices to improve cholesterol control. The ASCVD Risk score (Arnett DK, et al., 2019) failed to calculate for the following reasons:   Risk score cannot be calculated because patient has a medical history suggesting prior/existing ASCVD   * - Cholesterol units were assumed       Hx of adenomatous colonic polyps   Again refer back to GI for rpt colonoscopy.       Prediabetes   Reviewed with patient. Rec limiting sugars and simple carbs       Abnormal breath sounds   Ongoing.  Prior CXR with bronchiectasis, bronchial wall thickening with R>L reticulonodular opacities ?bronchitis vs atypical infection. Pt largely asxs but does note exertional dyspnea ie when taking out trash bin on an incline. Rx zpack, update if ongoing for advanced lung imaging.  Ex smoker.  Endorses minimal MJ use.  Has declined lung cancer screening program.       Obesity, Class I, BMI 30-34.9   Reviewed weight gain noted. Encouraged healthy diet and lifestyle choices to effect sustainable weight loss.       Other Visit Diagnoses       Special screening for malignant neoplasms, colon       Relevant Orders   Ambulatory referral to Gastroenterology        Meds ordered this encounter  Medications   thyroid  (ARMOUR THYROID ) 30 MG tablet    Sig: TAKE 1 TABLET BY MOUTH 4 DAYS A WEEK AS DIRECTION ON MONDAY, WEDNESDAY, FRIDAY, SATURDAY    Dispense:  50 tablet    Refill:  3   thyroid  (ARMOUR) 90 MG tablet    Sig: TAKE 1 TABLET BY MOUTH EVERY DAY BEFORE BREAKFAST    Dispense:  90 tablet    Refill:  3   sildenafil  (VIAGRA ) 100 MG tablet    Sig: Take 0.5-1 tablets (50-100 mg total) by mouth as needed for erectile dysfunction.    Dispense:  9 tablet    Refill:  9   azithromycin (ZITHROMAX) 250 MG tablet    Sig: Take two tablets on day one followed by one tablet on days 2-5    Dispense:  6 each    Refill:  0    Orders Placed This Encounter  Procedures   Tdap vaccine greater  than or equal to 7yo IM   Ambulatory referral to Gastroenterology    Referral Priority:   Routine    Referral Type:   Consultation    Referral Reason:   Specialty Services Required    Number of Visits Requested:   1    Patient Instructions  Tdap today  You may call [facility] to schedule an appointment for colonoscopy at [phone].   Schedule eye exam for glaucoma screening.  Ongoing coarse breath sounds - take zpack antibiotic to see if any improvement  in shortness of breath with activity. Let me know if new or worsening respiratory symptoms.  Return in 1 year for next physical   Follow up plan: Return in about 1 year (around 06/06/2025) for annual exam, prior fasting for blood work.  Anton Blas, MD   "

## 2024-06-06 NOTE — Assessment & Plan Note (Addendum)
 Ongoing.  Prior CXR with bronchiectasis, bronchial wall thickening with R>L reticulonodular opacities ?bronchitis vs atypical infection. Pt largely asxs but does note exertional dyspnea ie when taking out trash bin on an incline. Rx zpack, update if ongoing for advanced lung imaging.  Ex smoker.  Endorses minimal MJ use.  Has declined lung cancer screening program.

## 2024-06-06 NOTE — Assessment & Plan Note (Signed)
 Reviewed with patient. Rec limiting sugars and simple carbs

## 2024-06-06 NOTE — Assessment & Plan Note (Signed)
"  Refill viagra   "

## 2024-06-06 NOTE — Assessment & Plan Note (Signed)
 Reviewed diet choices to improve cholesterol control. The ASCVD Risk score (Arnett DK, et al., 2019) failed to calculate for the following reasons:   Risk score cannot be calculated because patient has a medical history suggesting prior/existing ASCVD   * - Cholesterol units were assumed

## 2024-06-06 NOTE — Assessment & Plan Note (Signed)
 Reviewed weight gain noted. Encouraged healthy diet and lifestyle choices to effect sustainable weight loss.

## 2024-06-06 NOTE — Assessment & Plan Note (Signed)
 Chronic, stable on current armour thyroid  dosing - continue

## 2024-06-06 NOTE — Assessment & Plan Note (Addendum)
 Again refer back to GI for rpt colonoscopy.

## 2024-06-06 NOTE — Assessment & Plan Note (Signed)
 Again declines lung cancer screening CT

## 2024-06-06 NOTE — Assessment & Plan Note (Signed)
 Preventative protocols reviewed and updated unless pt declined. Discussed healthy diet and lifestyle.

## 2024-06-06 NOTE — Patient Instructions (Addendum)
 Tdap today  You may call [facility] to schedule an appointment for colonoscopy at [phone].   Schedule eye exam for glaucoma screening.  Ongoing coarse breath sounds - take zpack antibiotic to see if any improvement in shortness of breath with activity. Let me know if new or worsening respiratory symptoms.  Return in 1 year for next physical
